# Patient Record
Sex: Female | Born: 1935 | Race: White | Hispanic: No | Smoking: Never smoker
Health system: Southern US, Community
[De-identification: ages and names within clinical notes are randomized; demographics above are authoritative.]

## PROBLEM LIST (undated history)

## (undated) DIAGNOSIS — K219 Gastro-esophageal reflux disease without esophagitis: Secondary | ICD-10-CM

## (undated) DIAGNOSIS — B029 Zoster without complications: Secondary | ICD-10-CM

## (undated) DIAGNOSIS — I739 Peripheral vascular disease, unspecified: Secondary | ICD-10-CM

## (undated) DIAGNOSIS — K760 Fatty (change of) liver, not elsewhere classified: Secondary | ICD-10-CM

## (undated) DIAGNOSIS — K2270 Barrett's esophagus without dysplasia: Secondary | ICD-10-CM

## (undated) DIAGNOSIS — I10 Essential (primary) hypertension: Secondary | ICD-10-CM

## (undated) DIAGNOSIS — J45909 Unspecified asthma, uncomplicated: Secondary | ICD-10-CM

## (undated) DIAGNOSIS — K529 Noninfective gastroenteritis and colitis, unspecified: Secondary | ICD-10-CM

## (undated) DIAGNOSIS — H269 Unspecified cataract: Secondary | ICD-10-CM

## (undated) HISTORY — PX: EYE SURGERY: SHX253

## (undated) HISTORY — PX: CATARACT EXTRACTION: SUR2

## (undated) HISTORY — PX: SINUS SURGERY WITH INSTATRAK: SHX5215

---

## 2010-04-17 ENCOUNTER — Ambulatory Visit: Payer: Self-pay | Admitting: Internal Medicine

## 2010-05-30 ENCOUNTER — Ambulatory Visit: Payer: Self-pay | Admitting: Family Medicine

## 2010-06-29 ENCOUNTER — Ambulatory Visit: Payer: Self-pay | Admitting: Internal Medicine

## 2011-08-09 ENCOUNTER — Encounter: Payer: Self-pay | Admitting: *Deleted

## 2011-09-06 ENCOUNTER — Encounter: Payer: Self-pay | Admitting: *Deleted

## 2011-10-07 ENCOUNTER — Encounter: Payer: Self-pay | Admitting: *Deleted

## 2011-11-04 ENCOUNTER — Encounter: Payer: Self-pay | Admitting: *Deleted

## 2011-12-05 ENCOUNTER — Encounter: Payer: Self-pay | Admitting: *Deleted

## 2012-07-27 ENCOUNTER — Ambulatory Visit: Payer: Self-pay | Admitting: Family Medicine

## 2016-10-03 ENCOUNTER — Ambulatory Visit
Admission: RE | Admit: 2016-10-03 | Discharge: 2016-10-03 | Disposition: A | Discharge status: Home / Self Care | Payer: Medicare Other | Source: Ambulatory Visit | Attending: Specialist | Admitting: Specialist

## 2016-10-03 ENCOUNTER — Other Ambulatory Visit: Payer: Self-pay | Admitting: Specialist

## 2016-10-03 DIAGNOSIS — R05 Cough: Secondary | ICD-10-CM

## 2016-10-03 DIAGNOSIS — R918 Other nonspecific abnormal finding of lung field: Secondary | ICD-10-CM | POA: Insufficient documentation

## 2016-10-03 DIAGNOSIS — R059 Cough, unspecified: Secondary | ICD-10-CM

## 2017-06-05 ENCOUNTER — Ambulatory Visit
Admission: EM | Admit: 2017-06-05 | Discharge: 2017-06-05 | Disposition: A | Discharge status: Home / Self Care | Payer: Medicare Other | Attending: Family Medicine | Admitting: Family Medicine

## 2017-06-05 ENCOUNTER — Encounter: Payer: Self-pay | Admitting: *Deleted

## 2017-06-05 DIAGNOSIS — M5432 Sciatica, left side: Secondary | ICD-10-CM

## 2017-06-05 HISTORY — DX: Fatty (change of) liver, not elsewhere classified: K76.0

## 2017-06-05 HISTORY — DX: Unspecified asthma, uncomplicated: J45.909

## 2017-06-05 HISTORY — DX: Gastro-esophageal reflux disease without esophagitis: K21.9

## 2017-06-05 HISTORY — DX: Peripheral vascular disease, unspecified: I73.9

## 2017-06-05 HISTORY — DX: Barrett's esophagus without dysplasia: K22.70

## 2017-06-05 HISTORY — DX: Essential (primary) hypertension: I10

## 2017-06-05 HISTORY — DX: Zoster without complications: B02.9

## 2017-06-05 HISTORY — DX: Unspecified cataract: H26.9

## 2017-06-05 HISTORY — DX: Noninfective gastroenteritis and colitis, unspecified: K52.9

## 2017-06-05 MED ORDER — PREDNISONE 20 MG PO TABS: ORAL_TABLET | ORAL | 0 refills | Status: AC

## 2017-06-05 MED ORDER — HYDROCODONE-ACETAMINOPHEN 5-325 MG PO TABS: ORAL_TABLET | ORAL | 0 refills | Status: AC

## 2017-06-05 NOTE — ED Provider Notes (Signed)
[facility]    CSN: [id number] Arrival date & time: 06/05/17  0947     History   Chief Complaint Chief Complaint  Patient presents with  . Back Pain  . Hip Pain    HPI Omeka TACEY DIMAGGIO is a 82 y.o. female.   82 yo female with a c/o left low back and left buttock pain radiating down the back of the leg since yesterday. Denies any falls, injuries, heavy lifting, numbness/tingling, bowel or bladder incontinence or retention.    The history is provided by the patient.  Back Pain  Hip Pain     Past Medical History:  Diagnosis Date  . Asthma   . Barrett esophagus   . Cataract   . Colitis   . Fatty liver   . GERD (gastroesophageal reflux disease)   . Herpes zoster   . Hypertension   . PVD (peripheral vascular disease) (HCC)     There are no active problems to display for this patient.   Past Surgical History:  Procedure Laterality Date  . CATARACT EXTRACTION    . EYE SURGERY    . SINUS SURGERY WITH INSTATRAK      OB History    No data available       Home Medications    Prior to Admission medications   Medication Sig Start Date End Date Taking? Authorizing Provider  albuterol (PROVENTIL HFA;VENTOLIN HFA) 108 (90 Base) MCG/ACT inhaler Inhale 2 puffs into the lungs every 6 (six) hours as needed for wheezing or shortness of breath.   Yes [provider]  amLODipine (NORVASC) 5 MG tablet Take 5 mg by mouth daily.   Yes [provider]  azelastine (ASTELIN) 0.1 % nasal spray Place 2 sprays into both nostrils 2 (two) times daily. Use in each nostril as directed   Yes [provider]  diclofenac sodium (VOLTAREN) 1 % GEL Apply 2 g topically 4 (four) times daily.   Yes [provider]  esomeprazole (NEXIUM) 40 MG capsule Take 40 mg by mouth daily at 12 noon.   Yes [provider]  Fluticasone-Salmeterol (ADVAIR) 250-50 MCG/DOSE AEPB Inhale 1 puff into the lungs 2 (two) times daily.   Yes [provider]  levothyroxine (SYNTHROID, LEVOTHROID) 25 MCG tablet Take 25 mcg by mouth daily before breakfast.   Yes [provider]  MOMETASONE FUROATE NA Place into the nose.   Yes [provider]  montelukast (SINGULAIR) 10 MG tablet Take 10 mg by mouth at bedtime.   Yes [provider]  SUMAtriptan (IMITREX) 50 MG tablet Take 50 mg by mouth every 2 (two) hours as needed for migraine. May repeat in 2 hours if headache persists or recurs.   Yes [provider]  tiotropium (SPIRIVA) 18 MCG inhalation capsule Place 18 mcg into inhaler and inhale daily.   Yes [provider]  topiramate (TOPAMAX) 50 MG tablet Take 50 mg by mouth 2 (two) times daily.   Yes [provider]  HYDROcodone-acetaminophen (NORCO/VICODIN) 5-325 MG tablet 1-2 tabs po qd prn severe pain 06/05/17   Payton Mccallum, MD  predniSONE (DELTASONE) 20 MG tablet 2 tabs po qd for 2 days, then 1 tab po qd for 2 days, then half at tab po qd for 2 days 06/05/17   Payton Mccallum, MD    Family History Family History  Problem Relation Age of Onset  . Cancer Father   . Macular degeneration Sister   . Macular degeneration Brother  Social History Social History  Substance Use Topics  . Smoking status: Never Smoker  . Smokeless tobacco: Never Used  . Alcohol use No     Allergies   Erythromycin and Sulfa antibiotics   Review of Systems Review of Systems  Musculoskeletal: Positive for back pain.     Physical Exam Triage Vital Signs ED Triage Vitals  Enc Vitals Group     BP 06/05/17 1048 (!) 141/64     Pulse Rate 06/05/17 1048 90     Resp 06/05/17 1048 16     Temp 06/05/17 1048 98.3 F (36.8 C)     Temp Source 06/05/17 1048 Oral     SpO2 06/05/17 1048 97 %     Weight 06/05/17 1049 152 lb (68.9 kg)     Height 06/05/17 1049  (1.676 m)     Head Circumference --      Peak Flow --      Pain Score 06/05/17 1050 9     Pain Loc --      Pain Edu? --      Excl.  in GC? --    No data found.   Updated Vital Signs BP (!) 141/64 (BP Location: Left Arm)   Pulse 90   Temp 98.3 F (36.8 C) (Oral)   Resp 16   Ht  (1.676 m)   Wt 152 lb (68.9 kg)   SpO2 97%   BMI 24.53 kg/m   Visual Acuity Right Eye Distance:   Left Eye Distance:   Bilateral Distance:    Right Eye Near:   Left Eye Near:    Bilateral Near:     Physical Exam  Constitutional: She appears well-developed and well-nourished. No distress.  Musculoskeletal:       Lumbar back: She exhibits tenderness (over the left lumbar sacral and left buttock). She exhibits normal range of motion, no bony tenderness, no swelling, no edema, no deformity, no laceration, no pain and normal pulse.  Skin: She is not diaphoretic.  Nursing note and vitals reviewed.    UC Treatments / Results  Labs (all labs ordered are listed, but only abnormal results are displayed) Labs Reviewed - No data to display  EKG  EKG Interpretation None       Radiology No results found.  Procedures Procedures (including critical care time)  Medications Ordered in UC Medications - No data to display   Initial Impression / Assessment and Plan / UC Course  I have reviewed the triage vital signs and the nursing notes.  Pertinent labs & imaging results that were available during my care of the patient were reviewed by me and considered in my medical decision making (see chart for details).       Final Clinical Impressions(s) / UC Diagnoses   Final diagnoses:  Sciatica of left side    New Prescriptions Discharge Medication List as of 06/05/2017 11:59 AM    START taking these medications   Details  HYDROcodone-acetaminophen (NORCO/VICODIN) 5-325 MG tablet 1-2 tabs po qd prn severe pain, Print       1.  diagnosis reviewed with patient 2. rx as per orders above; reviewed possible side effects, interactions, risks and benefits  3. Recommend supportive treatment with otc tylenol prn 4.  Follow-up prn if symptoms worsen or don't improve  Controlled Substance Prescriptions [place] Controlled Substance Registry consulted? Not Applicable   Payton Mccallum, MD 06/05/17 [phone]

## 2017-06-05 NOTE — ED Triage Notes (Signed)
Low back pain, left hip pain. Radiates to left leg at times. Denies injury.

## 2017-06-09 ENCOUNTER — Ambulatory Visit
Admission: RE | Admit: 2017-06-09 | Discharge: 2017-06-09 | Disposition: A | Discharge status: Home / Self Care | Payer: Medicare Other | Source: Ambulatory Visit | Attending: Specialist | Admitting: Specialist

## 2017-06-09 ENCOUNTER — Other Ambulatory Visit: Payer: Self-pay | Admitting: Specialist

## 2017-06-09 DIAGNOSIS — M5440 Lumbago with sciatica, unspecified side: Secondary | ICD-10-CM

## 2017-06-09 DIAGNOSIS — M47816 Spondylosis without myelopathy or radiculopathy, lumbar region: Secondary | ICD-10-CM | POA: Insufficient documentation

## 2017-06-09 DIAGNOSIS — M549 Dorsalgia, unspecified: Secondary | ICD-10-CM | POA: Diagnosis present

## 2017-08-07 ENCOUNTER — Telehealth (HOSPITAL_COMMUNITY): Payer: Self-pay

## 2017-08-07 NOTE — Telephone Encounter (Signed)
Attempted to call patient in regards to Pulmonary Rehab - Lm on Vm °

## 2017-08-07 NOTE — Telephone Encounter (Signed)
Patient's insurance is active and benefits verified through [facility] - $20.00 co-pay, no deductible, out of pocket amount of $4,400/$1,046.60 has been met, no co-insurance, and no pre-authorization is required. Reference [phone]  Patient will be contacted and scheduled.

## 2017-08-07 NOTE — Telephone Encounter (Signed)
Patient returned phone call in regards to Pulmonary Rehab - Patient would like to participate in program at [facility]. Faxed over referral to [facility]. Closing referral.

## 2017-09-18 ENCOUNTER — Encounter: Payer: Medicare Other | Attending: Pulmonary Disease

## 2017-09-18 ENCOUNTER — Other Ambulatory Visit: Payer: Self-pay

## 2017-09-18 VITALS — Ht 66.0 in | Wt 151.5 lb

## 2017-09-18 DIAGNOSIS — J4540 Moderate persistent asthma, uncomplicated: Secondary | ICD-10-CM | POA: Insufficient documentation

## 2017-09-18 DIAGNOSIS — I739 Peripheral vascular disease, unspecified: Secondary | ICD-10-CM | POA: Insufficient documentation

## 2017-09-18 DIAGNOSIS — I10 Essential (primary) hypertension: Secondary | ICD-10-CM | POA: Diagnosis not present

## 2017-09-18 DIAGNOSIS — K219 Gastro-esophageal reflux disease without esophagitis: Secondary | ICD-10-CM | POA: Insufficient documentation

## 2017-09-18 DIAGNOSIS — Z79899 Other long term (current) drug therapy: Secondary | ICD-10-CM | POA: Insufficient documentation

## 2017-09-18 NOTE — Patient Instructions (Signed)
Patient Instructions  Patient Details  Name: Kim Owen MRN: [id number] Date of Birth: 07/02/1935 Referring Provider:  Otis Dials, MD  Below are the personal goals you chose as well as exercise and nutrition goals. Our goal is to help you keep on track towards obtaining and maintaining your goals. We will be discussing your progress on these goals with you throughout the program.  Initial Exercise Prescription: Initial Exercise Prescription - 09/18/17 1600      Date of Initial Exercise RX and Referring Provider   Date  09/18/17    Referring Provider  Otis Dials MD      Treadmill   MPH  1.5    Grade  0.5    Minutes  15    METs  2.25      NuStep   Level  1    SPM  80    Minutes  15    METs  2      Arm Ergometer   Level  1    Watts  22    RPM  25    Minutes  15    METs  2      Prescription Details   Frequency (times per week)  3    Duration  Progress to 45 minutes of aerobic exercise without signs/symptoms of physical distress      Intensity   THRR 40-80% of Max Heartrate  106-129    Ratings of Perceived Exertion  11-13    Perceived Dyspnea  0-4      Progression   Progression  Continue to progress workloads to maintain intensity without signs/symptoms of physical distress.      Resistance Training   Training Prescription  Yes    Weight  3 lbs    Reps  10-15       Exercise Goals: Frequency: Be able to perform aerobic exercise three times per week working toward 3-5 days per week.  Intensity: Work with a perceived exertion of 11 (fairly light) - 15 (hard) as tolerated. Follow your new exercise prescription and watch for changes in prescription as you progress with the program. Changes will be reviewed with you when they are made.  Duration: You should be able to do 30 minutes of continuous aerobic exercise in addition to a 5 minute warm-up and a 5 minute cool-down routine.  Nutrition Goals: Your personal nutrition goals will be established  when you do your nutrition analysis with the dietician.  The following are nutrition guidelines to follow: Cholesterol < 200mg /day Sodium < 1500mg /day Fiber: Women over 50 yrs - 21 grams per day  Personal Goals: Personal Goals and Risk Factors at Admission - 09/18/17 1528      Core Components/Risk Factors/Patient Goals on Admission    Weight Management  Yes;Weight Loss    Intervention  Weight Management: Develop a combined nutrition and exercise program designed to reach desired caloric intake, while maintaining appropriate intake of nutrient and fiber, sodium and fats, and appropriate energy expenditure required for the weight goal.;Weight Management: Provide education and appropriate resources to help participant work on and attain dietary goals.;Weight Management/Obesity: Establish reasonable short term and long term weight goals.    Admit Weight  151 lb 8 oz (68.7 kg)    Goal Weight: Short Term  145 lb (65.8 kg)    Goal Weight: Long Term  145 lb (65.8 kg)    Expected Outcomes  Short Term: Continue to assess and modify interventions until short term weight is achieved;Long  Term: Adherence to nutrition and physical activity/exercise program aimed toward attainment of established weight goal;Weight Loss: Understanding of general recommendations for a balanced deficit meal plan, which promotes 1-2 lb weight loss per week and includes a negative energy balance of [phone] kcal/d;Understanding recommendations for meals to include 15-35% energy as protein, 25-35% energy from fat, 35-60% energy from carbohydrates, less than 200mg  of dietary cholesterol, 20-35 gm of total fiber daily;Understanding of distribution of calorie intake throughout the day with the consumption of 4-5 meals/snacks    Improve shortness of breath with ADL's  Yes    Intervention  Provide education, individualized exercise plan and daily activity instruction to help decrease symptoms of SOB with activities of daily living.     Expected Outcomes  Short Term: Achieves a reduction of symptoms when performing activities of daily living.    Hypertension  Yes takes medication    Intervention  Provide education on lifestyle modifcations including regular physical activity/exercise, weight management, moderate sodium restriction and increased consumption of fresh fruit, vegetables, and low fat dairy, alcohol moderation, and smoking cessation.;Monitor prescription use compliance.    Expected Outcomes  Short Term: Continued assessment and intervention until BP is < 140/88mm HG in hypertensive participants. < 130/85mm HG in hypertensive participants with diabetes, heart failure or chronic kidney disease.;Long Term: Maintenance of blood pressure at goal levels.       Tobacco Use Initial Evaluation: Social History   Tobacco Use  Smoking Status Never Smoker  Smokeless Tobacco Never Used    Exercise Goals and Review: Exercise Goals    Row Name 09/18/17 1619             Exercise Goals   Increase Physical Activity  Yes       Intervention  Provide advice, education, support and counseling about physical activity/exercise needs.;Develop an individualized exercise prescription for aerobic and resistive training based on initial evaluation findings, risk stratification, comorbidities and participant's personal goals.       Expected Outcomes  Achievement of increased cardiorespiratory fitness and enhanced flexibility, muscular endurance and strength shown through measurements of functional capacity and personal statement of participant.       Increase Strength and Stamina  Yes       Intervention  Provide advice, education, support and counseling about physical activity/exercise needs.;Develop an individualized exercise prescription for aerobic and resistive training based on initial evaluation findings, risk stratification, comorbidities and participant's personal goals.       Expected Outcomes  Achievement of increased  cardiorespiratory fitness and enhanced flexibility, muscular endurance and strength shown through measurements of functional capacity and personal statement of participant.       Able to understand and use rate of perceived exertion (RPE) scale  Yes       Intervention  Provide education and explanation on how to use RPE scale       Expected Outcomes  Short Term: Able to use RPE daily in rehab to express subjective intensity level;Long Term:  Able to use RPE to guide intensity level when exercising independently       Able to understand and use Dyspnea scale  Yes       Intervention  Provide education and explanation on how to use Dyspnea scale       Expected Outcomes  Short Term: Able to use Dyspnea scale daily in rehab to express subjective sense of shortness of breath during exertion;Long Term: Able to use Dyspnea scale to guide intensity level when exercising independently  Knowledge and understanding of Target Heart Rate Range (THRR)  Yes       Intervention  Provide education and explanation of THRR including how the numbers were predicted and where they are located for reference       Expected Outcomes  Short Term: Able to state/look up THRR;Long Term: Able to use THRR to govern intensity when exercising independently;Short Term: Able to use daily as guideline for intensity in rehab       Able to check pulse independently  Yes       Intervention  Provide education and demonstration on how to check pulse in carotid and radial arteries.;Review the importance of being able to check your own pulse for safety during independent exercise       Expected Outcomes  Short Term: Able to explain why pulse checking is important during independent exercise;Long Term: Able to check pulse independently and accurately       Understanding of Exercise Prescription  Yes       Intervention  Provide education, explanation, and written materials on patient's individual exercise prescription       Expected Outcomes   Short Term: Able to explain program exercise prescription;Long Term: Able to explain home exercise prescription to exercise independently          Copy of goals given to participant.

## 2017-09-18 NOTE — Progress Notes (Signed)
Pulmonary Individual Treatment Plan  Patient Details  Name: Kim Owen MRN: [id number] Date of Birth: 1935/08/09 Referring Provider:     Pulmonary Rehab from 09/18/2017 in [facility] Cardiac and Pulmonary Rehab  Referring Provider  Otis Dials MD      Initial Encounter Date:    Pulmonary Rehab from 09/18/2017 in [facility] Cardiac and Pulmonary Rehab  Date  09/18/17  Referring Provider  Otis Dials MD      Visit Diagnosis: Moderate persistent asthma without complication  Patient's Home Medications on Admission:  Current Outpatient Medications:  .  albuterol (PROVENTIL HFA;VENTOLIN HFA) 108 (90 Base) MCG/ACT inhaler, Inhale 2 puffs into the lungs every 6 (six) hours as needed for wheezing or shortness of breath., Disp: , Rfl:  .  amLODipine (NORVASC) 5 MG tablet, Take 5 mg by mouth daily., Disp: , Rfl:  .  azelastine (ASTELIN) 0.1 % nasal spray, Place 2 sprays into both nostrils 2 (two) times daily. Use in each nostril as directed, Disp: , Rfl:  .  diclofenac sodium (VOLTAREN) 1 % GEL, Apply 2 g topically 4 (four) times daily., Disp: , Rfl:  .  esomeprazole (NEXIUM) 40 MG capsule, Take 40 mg by mouth daily at 12 noon., Disp: , Rfl:  .  Fluticasone-Salmeterol (ADVAIR) 250-50 MCG/DOSE AEPB, Inhale 1 puff into the lungs 2 (two) times daily., Disp: , Rfl:  .  HYDROcodone-acetaminophen (NORCO/VICODIN) 5-325 MG tablet, 1-2 tabs po qd prn severe pain, Disp: 6 tablet, Rfl: 0 .  levothyroxine (SYNTHROID, LEVOTHROID) 25 MCG tablet, Take 25 mcg by mouth daily before breakfast., Disp: , Rfl:  .  MOMETASONE FUROATE NA, Place into the nose., Disp: , Rfl:  .  montelukast (SINGULAIR) 10 MG tablet, Take 10 mg by mouth at bedtime., Disp: , Rfl:  .  predniSONE (DELTASONE) 20 MG tablet, 2 tabs po qd for 2 days, then 1 tab po qd for 2 days, then half at tab po qd for 2 days, Disp: 7 tablet, Rfl: 0 .  SUMAtriptan (IMITREX) 50 MG tablet, Take 50 mg by mouth every 2 (two) hours as needed for migraine. May  repeat in 2 hours if headache persists or recurs., Disp: , Rfl:  .  tiotropium (SPIRIVA) 18 MCG inhalation capsule, Place 18 mcg into inhaler and inhale daily., Disp: , Rfl:  .  topiramate (TOPAMAX) 50 MG tablet, Take 50 mg by mouth 2 (two) times daily., Disp: , Rfl:   Past Medical History: Past Medical History:  Diagnosis Date  . Asthma   . Barrett esophagus   . Cataract   . Colitis   . Fatty liver   . GERD (gastroesophageal reflux disease)   . Herpes zoster   . Hypertension   . PVD (peripheral vascular disease) (HCC)     Tobacco Use: Social History   Tobacco Use  Smoking Status Never Smoker  Smokeless Tobacco Never Used    Labs: Recent Review Flowsheet Data    There is no flowsheet data to display.       Pulmonary Assessment Scores: Pulmonary Assessment Scores    Row Name 09/18/17 1521         ADL UCSD   ADL Phase  Entry     SOB Score total  93     Rest  2     Walk  3     Stairs  5     Bath  4     Dress  4     Shop  4  CAT Score   CAT Score  26        Pulmonary Function Assessment: Pulmonary Function Assessment - 09/18/17 1523      Breath   Bilateral Breath Sounds  Clear    Shortness of Breath  Limiting activity;Yes       Exercise Target Goals: Date: 09/18/17  Exercise Program Goal: Individual exercise prescription set with THRR, safety & activity barriers. Participant demonstrates ability to understand and report RPE using BORG scale, to self-measure pulse accurately, and to acknowledge the importance of the exercise prescription.  Exercise Prescription Goal: Starting with aerobic activity 30 plus minutes a day, 3 days per week for initial exercise prescription. Provide home exercise prescription and guidelines that participant acknowledges understanding prior to discharge.  Activity Barriers & Risk Stratification: Activity Barriers & Cardiac Risk Stratification - 09/18/17 1613      Activity Barriers & Cardiac Risk Stratification    Activity Barriers  Muscular Weakness;Deconditioning;Shortness of Breath;Joint Problems;Arthritis;Back Problems;Balance Concerns right rotator cuff tear, occasional back pain       6 Minute Walk: 6 Minute Walk    Row Name 09/18/17 1610         6 Minute Walk   Phase  Initial     Distance  930 feet     Walk Time  6 minutes     # of Rest Breaks  0     MPH  1.76     METS  1.98     RPE  11     Perceived Dyspnea   1     VO2 Peak  6.94     Symptoms  Yes (comment)     Comments  SOB, staggered gait     Resting HR  84 bpm     Resting BP  136/70     Resting Oxygen Saturation   98 %     Exercise Oxygen Saturation  during 6 min walk  98 %     Max Ex. HR  110 bpm     Max Ex. BP  134/68     2 Minute Post BP  132/64       Interval HR   1 Minute HR  103     2 Minute HR  107     3 Minute HR  110     4 Minute HR  107     5 Minute HR  106     6 Minute HR  106     2 Minute Post HR  93     Interval Heart Rate?  Yes       Interval Oxygen   Interval Oxygen?  Yes     Baseline Oxygen Saturation %  98 %     1 Minute Oxygen Saturation %  98 %     1 Minute Liters of Oxygen  0 L Room Air     2 Minute Oxygen Saturation %  98 %     2 Minute Liters of Oxygen  0 L     3 Minute Oxygen Saturation %  98 %     3 Minute Liters of Oxygen  0 L     4 Minute Oxygen Saturation %  98 %     4 Minute Liters of Oxygen  0 L     5 Minute Oxygen Saturation %  98 %     5 Minute Liters of Oxygen  0 L     6 Minute Oxygen Saturation %  99 %     6 Minute Liters of Oxygen  0 L     2 Minute Post Oxygen Saturation %  99 %     2 Minute Post Liters of Oxygen  0 L       Oxygen Initial Assessment: Oxygen Initial Assessment - 09/18/17 1526      Home Oxygen   Home Oxygen Device  None    Sleep Oxygen Prescription  None;CPAP never owned or used a CPAP    Home Exercise Oxygen Prescription  None    Home at Rest Exercise Oxygen Prescription  None    Compliance with Home Oxygen Use  No    Comments  -- does not wear CPAP       Initial 6 min Walk   Oxygen Used  None      Program Oxygen Prescription   Program Oxygen Prescription  None      Intervention   Short Term Goals  To learn and demonstrate proper use of respiratory medications;To learn and demonstrate proper pursed lip breathing techniques or other breathing techniques.;To learn and understand importance of maintaining oxygen saturations>88%;To learn and understand importance of monitoring SPO2 with pulse oximeter and demonstrate accurate use of the pulse oximeter.    Long  Term Goals  Demonstrates proper use of MDI's;Compliance with respiratory medication;Exhibits proper breathing techniques, such as pursed lip breathing or other method taught during program session;Maintenance of O2 saturations>88%;Verbalizes importance of monitoring SPO2 with pulse oximeter and return demonstration       Oxygen Re-Evaluation:   Oxygen Discharge (Final Oxygen Re-Evaluation):   Initial Exercise Prescription: Initial Exercise Prescription - 09/18/17 1600      Date of Initial Exercise RX and Referring Provider   Date  09/18/17    Referring Provider  Otis Dials MD      Treadmill   MPH  1.5    Grade  0.5    Minutes  15    METs  2.25      NuStep   Level  1    SPM  80    Minutes  15    METs  2      Arm Ergometer   Level  1    Watts  22    RPM  25    Minutes  15    METs  2      Prescription Details   Frequency (times per week)  3    Duration  Progress to 45 minutes of aerobic exercise without signs/symptoms of physical distress      Intensity   THRR 40-80% of Max Heartrate  106-129    Ratings of Perceived Exertion  11-13    Perceived Dyspnea  0-4      Progression   Progression  Continue to progress workloads to maintain intensity without signs/symptoms of physical distress.      Resistance Training   Training Prescription  Yes    Weight  3 lbs    Reps  10-15       Perform Capillary Blood Glucose checks as needed.  Exercise  Prescription Changes: Exercise Prescription Changes    Row Name 09/18/17 1600             Response to Exercise   Blood Pressure (Admit)  136/70       Blood Pressure (Exercise)  134/68       Blood Pressure (Exit)  132/64       Heart Rate (Admit)  84 bpm  Heart Rate (Exercise)  110 bpm       Heart Rate (Exit)  93 bpm       Oxygen Saturation (Admit)  98 %       Oxygen Saturation (Exercise)  98 %       Oxygen Saturation (Exit)  99 %       Rating of Perceived Exertion (Exercise)  11       Perceived Dyspnea (Exercise)  1       Symptoms  SOB, staggered gait       Comments  walk test results          Exercise Comments:   Exercise Goals and Review: Exercise Goals    Row Name 09/18/17 1619             Exercise Goals   Increase Physical Activity  Yes       Intervention  Provide advice, education, support and counseling about physical activity/exercise needs.;Develop an individualized exercise prescription for aerobic and resistive training based on initial evaluation findings, risk stratification, comorbidities and participant's personal goals.       Expected Outcomes  Achievement of increased cardiorespiratory fitness and enhanced flexibility, muscular endurance and strength shown through measurements of functional capacity and personal statement of participant.       Increase Strength and Stamina  Yes       Intervention  Provide advice, education, support and counseling about physical activity/exercise needs.;Develop an individualized exercise prescription for aerobic and resistive training based on initial evaluation findings, risk stratification, comorbidities and participant's personal goals.       Expected Outcomes  Achievement of increased cardiorespiratory fitness and enhanced flexibility, muscular endurance and strength shown through measurements of functional capacity and personal statement of participant.       Able to understand and use rate of perceived exertion  (RPE) scale  Yes       Intervention  Provide education and explanation on how to use RPE scale       Expected Outcomes  Short Term: Able to use RPE daily in rehab to express subjective intensity level;Long Term:  Able to use RPE to guide intensity level when exercising independently       Able to understand and use Dyspnea scale  Yes       Intervention  Provide education and explanation on how to use Dyspnea scale       Expected Outcomes  Short Term: Able to use Dyspnea scale daily in rehab to express subjective sense of shortness of breath during exertion;Long Term: Able to use Dyspnea scale to guide intensity level when exercising independently       Knowledge and understanding of Target Heart Rate Range (THRR)  Yes       Intervention  Provide education and explanation of THRR including how the numbers were predicted and where they are located for reference       Expected Outcomes  Short Term: Able to state/look up THRR;Long Term: Able to use THRR to govern intensity when exercising independently;Short Term: Able to use daily as guideline for intensity in rehab       Able to check pulse independently  Yes       Intervention  Provide education and demonstration on how to check pulse in carotid and radial arteries.;Review the importance of being able to check your own pulse for safety during independent exercise       Expected Outcomes  Short Term: Able to explain why pulse checking is  important during independent exercise;Long Term: Able to check pulse independently and accurately       Understanding of Exercise Prescription  Yes       Intervention  Provide education, explanation, and written materials on patient's individual exercise prescription       Expected Outcomes  Short Term: Able to explain program exercise prescription;Long Term: Able to explain home exercise prescription to exercise independently          Exercise Goals Re-Evaluation :   Discharge Exercise Prescription (Final  Exercise Prescription Changes): Exercise Prescription Changes - 09/18/17 1600      Response to Exercise   Blood Pressure (Admit)  136/70    Blood Pressure (Exercise)  134/68    Blood Pressure (Exit)  132/64    Heart Rate (Admit)  84 bpm    Heart Rate (Exercise)  110 bpm    Heart Rate (Exit)  93 bpm    Oxygen Saturation (Admit)  98 %    Oxygen Saturation (Exercise)  98 %    Oxygen Saturation (Exit)  99 %    Rating of Perceived Exertion (Exercise)  11    Perceived Dyspnea (Exercise)  1    Symptoms  SOB, staggered gait    Comments  walk test results       Nutrition:  Target Goals: Understanding of nutrition guidelines, daily intake of sodium 1500mg , cholesterol 200mg , calories 30% from fat and 7% or less from saturated fats, daily to have 5 or more servings of fruits and vegetables.  Biometrics: Pre Biometrics - 09/18/17 1620      Pre Biometrics   Height  5\' 6"  (1.676 m)    Weight  151 lb 8 oz (68.7 kg)    Waist Circumference  36 inches    Hip Circumference  40 inches    Waist to Hip Ratio  0.9 %    BMI (Calculated)  24.46        Nutrition Therapy Plan and Nutrition Goals: Nutrition Therapy & Goals - 09/18/17 1519      Nutrition Therapy   RD appointment defered  --      Personal Nutrition Goals   Comments  patient would like to lose 10 pounds. patient stated that she knows what she should not eat but eats it anyways.      Intervention Plan   Intervention  Prescribe, educate and counsel regarding individualized specific dietary modifications aiming towards targeted core components such as weight, hypertension, lipid management, diabetes, heart failure and other comorbidities.;Nutrition handout(s) given to patient.    Expected Outcomes  Short Term Goal: Understand basic principles of dietary content, such as calories, fat, sodium, cholesterol and nutrients.;Long Term Goal: Adherence to prescribed nutrition plan.       Nutrition Discharge: Rate Your Plate  Scores: Nutrition Assessments - 09/18/17 1524      MEDFICTS Scores   Pre Score  34       Nutrition Goals Re-Evaluation:   Nutrition Goals Discharge (Final Nutrition Goals Re-Evaluation):   Psychosocial: Target Goals: Acknowledge presence or absence of significant depression and/or stress, maximize coping skills, provide positive support system. Participant is able to verbalize types and ability to use techniques and skills needed for reducing stress and depression.   Initial Review & Psychosocial Screening: Initial Psych Review & Screening - 09/18/17 1515      Initial Review   Current issues with  Current Depression;Current Sleep Concerns;Current Stress Concerns    Source of Stress Concerns  Chronic Illness;Family;Unable to perform  yard/household activities;Unable to participate in former interests or hobbies    Comments  Her son has been arrested for hugging people and has made a big impact on her and her family. She is also stressed because she cannot do some of the things she used to.       Family Dynamics   Good Support System?  Yes    Comments  Her son, daughter in law and daughter.      Barriers   Psychosocial barriers to participate in program  The patient should benefit from training in stress management and relaxation.      Screening Interventions   Interventions  Yes;Encouraged to exercise;Program counselor consult;Provide feedback about the scores to participant;To provide support and resources with identified psychosocial needs    Expected Outcomes  Long Term goal: The participant improves quality of Life and PHQ9 Scores as seen by post scores and/or verbalization of changes;Short Term goal: Identification and review with participant of any Quality of Life or Depression concerns found by scoring the questionnaire.;Long Term Goal: Stressors or current issues are controlled or eliminated.;Short Term goal: Utilizing psychosocial counselor, staff and physician to assist with  identification of specific Stressors or current issues interfering with healing process. Setting desired goal for each stressor or current issue identified.       Quality of Life Scores:   PHQ-9: Recent Review Flowsheet Data    Depression screen [facility] 2/9 09/18/2017   Decreased Interest 3   Down, Depressed, Hopeless 2   PHQ - 2 Score 5   Altered sleeping 2   Tired, decreased energy 3   Change in appetite 0   Feeling bad or failure about yourself  1   Trouble concentrating 1   Moving slowly or fidgety/restless 1   Suicidal thoughts 0   PHQ-9 Score 13   Difficult doing work/chores Somewhat difficult     Interpretation of Total Score  Total Score Depression Severity:  1-4 = Minimal depression, 5-9 = Mild depression, 10-14 = Moderate depression, 15-19 = Moderately severe depression, 20-27 = Severe depression   Psychosocial Evaluation and Intervention:   Psychosocial Re-Evaluation:   Psychosocial Discharge (Final Psychosocial Re-Evaluation):   Education: Education Goals: Education classes will be provided on a weekly basis, covering required topics. Participant will state understanding/return demonstration of topics presented.  Learning Barriers/Preferences: Learning Barriers/Preferences - 09/18/17 1526      Learning Barriers/Preferences   Learning Barriers  Sight wears reading glasses    Learning Preferences  None       Education Topics: Initial Evaluation Education: - Verbal, written and demonstration of respiratory meds, RPE/PD scales, oximetry and breathing techniques. Instruction on use of nebulizers and MDIs: cleaning and proper use, rinsing mouth with steroid doses and importance of monitoring MDI activations.   Pulmonary Rehab from 09/18/2017 in [facility] Cardiac and Pulmonary Rehab  Date  09/18/17  Educator  [facility]  Instruction Review Code  1- Verbalizes Understanding      General Nutrition Guidelines/Fats and Fiber: -Group instruction provided by verbal, written  material, models and posters to present the general guidelines for heart healthy nutrition. Gives an explanation and review of dietary fats and fiber.   Controlling Sodium/Reading Food Labels: -Group verbal and written material supporting the discussion of sodium use in heart healthy nutrition. Review and explanation with models, verbal and written materials for utilization of the food label.   Exercise Physiology & Risk Factors: - Group verbal and written instruction with models to review the exercise physiology of  the cardiovascular system and associated critical values. Details cardiovascular disease risk factors and the goals associated with each risk factor.   Aerobic Exercise & Resistance Training: - Gives group verbal and written discussion on the health impact of inactivity. On the components of aerobic and resistive training programs and the benefits of this training and how to safely progress through these programs.   Flexibility, Balance, General Exercise Guidelines: - Provides group verbal and written instruction on the benefits of flexibility and balance training programs. Provides general exercise guidelines with specific guidelines to those with heart or lung disease. Demonstration and skill practice provided.   Stress Management: - Provides group verbal and written instruction about the health risks of elevated stress, cause of high stress, and healthy ways to reduce stress.   Depression: - Provides group verbal and written instruction on the correlation between heart/lung disease and depressed mood, treatment options, and the stigmas associated with seeking treatment.   Exercise & Equipment Safety: - Individual verbal instruction and demonstration of equipment use and safety with use of the equipment.   Pulmonary Rehab from 09/18/2017 in [facility] Cardiac and Pulmonary Rehab  Date  09/18/17  Educator  [facility]  Instruction Review Code  1- Verbalizes Understanding       Infection Prevention: - Provides verbal and written material to individual with discussion of infection control including proper hand washing and proper equipment cleaning during exercise session.   Pulmonary Rehab from 09/18/2017 in [facility] Cardiac and Pulmonary Rehab  Date  09/18/17  Educator  [facility]  Instruction Review Code  1- Verbalizes Understanding      Falls Prevention: - Provides verbal and written material to individual with discussion of falls prevention and safety.   Pulmonary Rehab from 09/18/2017 in [facility] Cardiac and Pulmonary Rehab  Date  09/18/17  Educator  [facility]  Instruction Review Code  1- Verbalizes Understanding      Diabetes: - Individual verbal and written instruction to review signs/symptoms of diabetes, desired ranges of glucose level fasting, after meals and with exercise. Advice that pre and post exercise glucose checks will be done for 3 sessions at entry of program.   Chronic Lung Diseases: - Group verbal and written instruction to review new updates, new respiratory medications, new advancements in procedures and treatments. Provide informative websites and "800" numbers of self-education.   Lung Procedures: - Group verbal and written instruction to describe testing methods done to diagnose lung disease. Review the outcome of test results. Describe the treatment choices: Pulmonary Function Tests, ABGs and oximetry.   Energy Conservation: - Provide group verbal and written instruction for methods to conserve energy, plan and organize activities. Instruct on pacing techniques, use of adaptive equipment and posture/positioning to relieve shortness of breath.   Triggers: - Group verbal and written instruction to review types of environmental controls: home humidity, furnaces, filters, dust mite/pet prevention, HEPA vacuums. To discuss weather changes, air quality and the benefits of nasal washing.   Exacerbations: - Group verbal and written instruction to  provide: warning signs, infection symptoms, calling MD promptly, preventive modes, and value of vaccinations. Review: effective airway clearance, coughing and/or vibration techniques. Create an Sport and exercise psychologist.   Oxygen: - Individual and group verbal and written instruction on oxygen therapy. Includes supplement oxygen, available portable oxygen systems, continuous and intermittent flow rates, oxygen safety, concentrators, and Medicare reimbursement for oxygen.   Respiratory Medications: - Group verbal and written instruction to review medications for lung disease. Drug class, frequency, complications, importance of spacers, rinsing  mouth after steroid MDI's, and proper cleaning methods for nebulizers.   AED/CPR: - Group verbal and written instruction with the use of models to demonstrate the basic use of the AED with the basic ABC's of resuscitation.   Breathing Retraining: - Provides individuals verbal and written instruction on purpose, frequency, and proper technique of diaphragmatic breathing and pursed-lipped breathing. Applies individual practice skills.   Pulmonary Rehab from 09/18/2017 in [facility] Cardiac and Pulmonary Rehab  Date  09/18/17  Educator  [facility]  Instruction Review Code  1- Verbalizes Understanding      Anatomy and Physiology of the Lungs: - Group verbal and written instruction with the use of models to provide basic lung anatomy and physiology related to function, structure and complications of lung disease.   Anatomy & Physiology of the Heart: - Group verbal and written instruction and models provide basic cardiac anatomy and physiology, with the coronary electrical and arterial systems. Review of: AMI, Angina, Valve disease, Heart Failure, Cardiac Arrhythmia, Pacemakers, and the ICD.   Heart Failure: - Group verbal and written instruction on the basics of heart failure: signs/symptoms, treatments, explanation of ejection fraction, enlarged heart and  cardiomyopathy.   Sleep Apnea: - Individual verbal and written instruction to review Obstructive Sleep Apnea. Review of risk factors, methods for diagnosing and types of masks and machines for OSA.   Anxiety: - Provides group, verbal and written instruction on the correlation between heart/lung disease and anxiety, treatment options, and management of anxiety.   Relaxation: - Provides group, verbal and written instruction about the benefits of relaxation for patients with heart/lung disease. Also provides patients with examples of relaxation techniques.   Cardiac Medications: - Group verbal and written instruction to review commonly prescribed medications for heart disease. Reviews the medication, class of the drug, and side effects.   Know Your Numbers: -Group verbal and written instruction about important numbers in your health.  Review of Cholesterol, Blood Pressure, Diabetes, and BMI and the role they play in your overall health.   Other: -Provides group and verbal instruction on various topics (see comments)    Knowledge Questionnaire Score: Knowledge Questionnaire Score - 09/18/17 1523      Knowledge Questionnaire Score   Pre Score  14/18 reviewed with patient        Core Components/Risk Factors/Patient Goals at Admission: Personal Goals and Risk Factors at Admission - 09/18/17 1528      Core Components/Risk Factors/Patient Goals on Admission    Weight Management  Yes;Weight Loss    Intervention  Weight Management: Develop a combined nutrition and exercise program designed to reach desired caloric intake, while maintaining appropriate intake of nutrient and fiber, sodium and fats, and appropriate energy expenditure required for the weight goal.;Weight Management: Provide education and appropriate resources to help participant work on and attain dietary goals.;Weight Management/Obesity: Establish reasonable short term and long term weight goals.    Admit Weight  151 lb  8 oz (68.7 kg)    Goal Weight: Short Term  145 lb (65.8 kg)    Goal Weight: Long Term  145 lb (65.8 kg)    Expected Outcomes  Short Term: Continue to assess and modify interventions until short term weight is achieved;Long Term: Adherence to nutrition and physical activity/exercise program aimed toward attainment of established weight goal;Weight Loss: Understanding of general recommendations for a balanced deficit meal plan, which promotes 1-2 lb weight loss per week and includes a negative energy balance of [phone] kcal/d;Understanding recommendations for meals to include 15-35%  energy as protein, 25-35% energy from fat, 35-60% energy from carbohydrates, less than 200mg  of dietary cholesterol, 20-35 gm of total fiber daily;Understanding of distribution of calorie intake throughout the day with the consumption of 4-5 meals/snacks    Improve shortness of breath with ADL's  Yes    Intervention  Provide education, individualized exercise plan and daily activity instruction to help decrease symptoms of SOB with activities of daily living.    Expected Outcomes  Short Term: Achieves a reduction of symptoms when performing activities of daily living.    Hypertension  Yes takes medication    Intervention  Provide education on lifestyle modifcations including regular physical activity/exercise, weight management, moderate sodium restriction and increased consumption of fresh fruit, vegetables, and low fat dairy, alcohol moderation, and smoking cessation.;Monitor prescription use compliance.    Expected Outcomes  Short Term: Continued assessment and intervention until BP is < 140/10mm HG in hypertensive participants. < 130/34mm HG in hypertensive participants with diabetes, heart failure or chronic kidney disease.;Long Term: Maintenance of blood pressure at goal levels.       Core Components/Risk Factors/Patient Goals Review:    Core Components/Risk Factors/Patient Goals at Discharge (Final Review):     ITP Comments: ITP Comments    Row Name 09/18/17 1456           ITP Comments  Medical Evaluation completed. Chart sent for review and changes to Dr. Bethann Punches Director of [facility]. Diagnosis can be found in [facility] encounter 09/18/17          Comments: Initial ITP

## 2017-09-25 DIAGNOSIS — J4540 Moderate persistent asthma, uncomplicated: Secondary | ICD-10-CM

## 2017-09-25 NOTE — Progress Notes (Signed)
Daily Session Note  Patient Details  Name: Kim Owen MRN: [id number] Date of Birth: November 21, 1934 Referring Provider:     Pulmonary Rehab from 09/18/2017 in [facility] Cardiac and Pulmonary Rehab  Referring Provider  Thad Ranger MD      Encounter Date: 09/25/2017  Check In: Session Check In - 09/25/17 1137      Check-In   Location  [facility]-Cardiac & Pulmonary Rehab    Staff Present  Nada Maclachlan, BA, ACSM CEP, Exercise Physiologist;Kelly Amedeo Plenty, BS, ACSM CEP, Exercise Physiologist;Joseph Flavia Shipper    Supervising physician immediately available to respond to emergencies  [facility] immediately available ER MD    Physician(s)   Dr. Mable Paris and Cinda Quest    Medication changes reported      No    Fall or balance concerns reported     No    Warm-up and Cool-down  Performed as group-led instruction    Resistance Training Performed  Yes    VAD Patient?  No      Pain Assessment   Currently in Pain?  No/denies          Social History   Tobacco Use  Smoking Status Never Smoker  Smokeless Tobacco Never Used    Goals Met:  Exercise tolerated well Queuing for purse lip breathing No report of cardiac concerns or symptoms Strength training completed today  Goals Unmet:  Not Applicable  Comments: First full day of exercise!  Patient was oriented to gym and equipment including functions, settings, policies, and procedures.  Patient's individual exercise prescription and treatment plan were reviewed.  All starting workloads were established based on the results of the 6 minute walk test done at initial orientation visit.  The plan for exercise progression was also introduced and progression will be customized based on patient's performance and goals.   Dr. Emily Filbert is Medical Director for [place] and [facility].

## 2017-09-27 DIAGNOSIS — J4540 Moderate persistent asthma, uncomplicated: Secondary | ICD-10-CM

## 2017-09-27 NOTE — Progress Notes (Signed)
Daily Session Note  Patient Details  Name: Kim Owen MRN: [id number] Date of Birth: 03/12/1935 Referring Provider:     Pulmonary Rehab from 09/18/2017 in [facility] Cardiac and Pulmonary Rehab  Referring Provider  Thad Ranger MD      Encounter Date: 09/27/2017  Check In: Session Check In - 09/27/17 1122      Check-In   Location  [facility]-Cardiac & Pulmonary Rehab    Staff Present  Justin Mend RCP,RRT,BSRT;Meredith Sherryll Burger, RN BSN;Carroll Enterkin, RN, BSN    Supervising physician immediately available to respond to emergencies  [facility] immediately available ER MD    Physician(s)  Dr. Mariea Clonts and Jimmye Norman    Medication changes reported      No    Fall or balance concerns reported     No    Warm-up and Cool-down  Performed as group-led instruction    Resistance Training Performed  Yes    VAD Patient?  No      Pain Assessment   Currently in Pain?  No/denies          Social History   Tobacco Use  Smoking Status Never Smoker  Smokeless Tobacco Never Used    Goals Met:  Independence with exercise equipment Exercise tolerated well No report of cardiac concerns or symptoms Strength training completed today  Goals Unmet:  Not Applicable  Comments: Pt able to follow exercise prescription today without complaint.  Will continue to monitor for progression.   Dr. Emily Filbert is Medical Director for [place] and [facility].

## 2017-09-29 DIAGNOSIS — J4540 Moderate persistent asthma, uncomplicated: Secondary | ICD-10-CM

## 2017-09-29 NOTE — Progress Notes (Signed)
Daily Session Note  Patient Details  Name: Kim Owen MRN: [id number] Date of Birth: 1934/09/17 Referring Provider:     Pulmonary Rehab from 09/18/2017 in [facility] Cardiac and Pulmonary Rehab  Referring Provider  Thad Ranger MD      Encounter Date: 09/29/2017  Check In: Session Check In - 09/29/17 1141      Check-In   Location  [facility]-Cardiac & Pulmonary Rehab    Staff Present  Heath Lark, RN, BSN, CCRP;Joseph Hood RCP,RRT,BSRT;Mandi Vermillion, [place], [facility]    Supervising physician immediately available to respond to emergencies  [facility] immediately available ER MD    Physician(s)  Dr. Mariea Clonts and Jacqualine Code    Medication changes reported      No    Fall or balance concerns reported     No    Warm-up and Cool-down  Performed as group-led instruction    Resistance Training Performed  Yes    VAD Patient?  No      Pain Assessment   Currently in Pain?  No/denies          Social History   Tobacco Use  Smoking Status Never Smoker  Smokeless Tobacco Never Used    Goals Met:  Independence with exercise equipment Exercise tolerated well No report of cardiac concerns or symptoms Strength training completed today  Goals Unmet:  Not Applicable  Comments: Pt able to follow exercise prescription today without complaint.  Will continue to monitor for progression.   Dr. Emily Filbert is Medical Director for [place] and [facility].

## 2017-10-02 DIAGNOSIS — J4540 Moderate persistent asthma, uncomplicated: Secondary | ICD-10-CM | POA: Diagnosis not present

## 2017-10-02 NOTE — Progress Notes (Signed)
Daily Session Note  Patient Details  Name: Kim Owen MRN: [id number] Date of Birth: [birth year] Referring Provider:     Pulmonary Rehab from 09/18/2017 in [facility] Cardiac and Pulmonary Rehab  Referring Provider  Thad Ranger MD      Encounter Date: 10/02/2017  Check In: Session Check In - 10/02/17 1130      Check-In   Location  [facility]-Cardiac & Pulmonary Rehab    Staff Present  Nada Maclachlan, BA, ACSM CEP, Exercise Physiologist;Kelly Amedeo Plenty, BS, ACSM CEP, Exercise Physiologist;Zemira Zehring Flavia Shipper    Supervising physician immediately available to respond to emergencies  [facility] immediately available ER MD    Physician(s)  Dr. Lamar Laundry and Mariea Clonts    Medication changes reported      No    Fall or balance concerns reported     No    Warm-up and Cool-down  Performed as group-led instruction    Resistance Training Performed  Yes    VAD Patient?  No      Pain Assessment   Currently in Pain?  No/denies          Social History   Tobacco Use  Smoking Status Never Smoker  Smokeless Tobacco Never Used    Goals Met:  Independence with exercise equipment Exercise tolerated well No report of cardiac concerns or symptoms Strength training completed today  Goals Unmet:  Not Applicable  Comments: Pt able to follow exercise prescription today without complaint.  Will continue to monitor for progression.   Dr. Emily Filbert is Medical Director for [place] and [facility].

## 2017-10-06 ENCOUNTER — Encounter: Payer: Medicare Other | Attending: Pulmonary Disease

## 2017-10-06 DIAGNOSIS — J4540 Moderate persistent asthma, uncomplicated: Secondary | ICD-10-CM

## 2017-10-06 DIAGNOSIS — I10 Essential (primary) hypertension: Secondary | ICD-10-CM | POA: Insufficient documentation

## 2017-10-06 DIAGNOSIS — K219 Gastro-esophageal reflux disease without esophagitis: Secondary | ICD-10-CM | POA: Insufficient documentation

## 2017-10-06 DIAGNOSIS — Z79899 Other long term (current) drug therapy: Secondary | ICD-10-CM | POA: Insufficient documentation

## 2017-10-06 DIAGNOSIS — I739 Peripheral vascular disease, unspecified: Secondary | ICD-10-CM | POA: Insufficient documentation

## 2017-10-06 NOTE — Progress Notes (Signed)
Daily Session Note  Patient Details  Name: HANIN DECOOK MRN: [id number] Date of Birth: May 06, 1935 Referring Provider:     Pulmonary Rehab from 09/18/2017 in [facility] Cardiac and Pulmonary Rehab  Referring Provider  Thad Ranger MD      Encounter Date: 10/06/2017  Check In: Session Check In - 10/06/17 1151      Check-In   Location  [facility]-Cardiac & Pulmonary Rehab    Staff Present  Renita Papa, RN BSN;Jessica Luan Pulling, MA, RCEP, CCRP, Exercise Physiologist;Terral Cooks Flavia Shipper    Supervising physician immediately available to respond to emergencies  [facility] immediately available ER MD    Physician(s)   Dr. Burlene Arnt and Clearnce Hasten    Medication changes reported      No    Fall or balance concerns reported     No    Warm-up and Cool-down  Performed as group-led instruction    Resistance Training Performed  Yes    VAD Patient?  No      Pain Assessment   Currently in Pain?  No/denies          Social History   Tobacco Use  Smoking Status Never Smoker  Smokeless Tobacco Never Used    Goals Met:  Independence with exercise equipment Exercise tolerated well No report of cardiac concerns or symptoms Strength training completed today  Goals Unmet:  Not Applicable  Comments: Pt able to follow exercise prescription today without complaint.  Will continue to monitor for progression. Reviewed home exercise with pt today.  Pt plans to walk at home for exercise. She is going to be in [place] for the next two week and will be going to the gym up there. Reviewed THR, pulse, RPE, sign and symptoms, NTG use, and when to call 911 or MD.  Also discussed weather considerations and indoor options.  Pt voiced understanding.  Dr. Emily Filbert is Medical Director for [place] and [facility].

## 2017-10-16 DIAGNOSIS — J4540 Moderate persistent asthma, uncomplicated: Secondary | ICD-10-CM

## 2017-10-16 NOTE — Progress Notes (Signed)
Pulmonary Individual Treatment Plan  Patient Details  Name: Kim Owen  MRN: [id number] Date of Birth: Apr 26, 1935 Referring Provider:     Pulmonary Rehab from 09/18/2017 in [facility] Cardiac and Pulmonary Rehab  Referring Provider  Thad Ranger MD      Initial Encounter Date:    Pulmonary Rehab from 09/18/2017 in [facility] Cardiac and Pulmonary Rehab  Date  09/18/17  Referring Provider  Thad Ranger MD      Visit Diagnosis: Moderate persistent asthma without complication  Patient's Home Medications on Admission:  Current Outpatient Medications:  .  albuterol (PROVENTIL HFA;VENTOLIN HFA) 108 (90 Base) MCG/ACT inhaler, Inhale 2 puffs into the lungs every 6 (six) hours as needed for wheezing or shortness of breath., Disp: , Rfl:  .  amLODipine (NORVASC) 5 MG tablet, Take 5 mg by mouth daily., Disp: , Rfl:  .  azelastine (ASTELIN) 0.1 % nasal spray, Place 2 sprays into both nostrils 2 (two) times daily. Use in each nostril as directed, Disp: , Rfl:  .  diclofenac sodium (VOLTAREN) 1 % GEL, Apply 2 g topically 4 (four) times daily., Disp: , Rfl:  .  esomeprazole (NEXIUM) 40 MG capsule, Take 40 mg by mouth daily at 12 noon., Disp: , Rfl:  .  Fluticasone-Salmeterol (ADVAIR) 250-50 MCG/DOSE AEPB, Inhale 1 puff into the lungs 2 (two) times daily., Disp: , Rfl:  .  HYDROcodone-acetaminophen (NORCO/VICODIN) 5-325 MG tablet, 1-2 tabs po qd prn severe pain, Disp: 6 tablet, Rfl: 0 .  levothyroxine (SYNTHROID, LEVOTHROID) 25 MCG tablet, Take 25 mcg by mouth daily before breakfast., Disp: , Rfl:  .  MOMETASONE FUROATE NA, Place into the nose., Disp: , Rfl:  .  montelukast (SINGULAIR) 10 MG tablet, Take 10 mg by mouth at bedtime., Disp: , Rfl:  .  predniSONE (DELTASONE) 20 MG tablet, 2 tabs po qd for 2 days, then 1 tab po qd for 2 days, then half at tab po qd for 2 days, Disp: 7 tablet, Rfl: 0 .  SUMAtriptan (IMITREX) 50 MG tablet, Take 50 mg by mouth every 2 (two) hours as needed for migraine. May  repeat in 2 hours if headache persists or recurs., Disp: , Rfl:  .  tiotropium (SPIRIVA) 18 MCG inhalation capsule, Place 18 mcg into inhaler and inhale daily., Disp: , Rfl:  .  topiramate (TOPAMAX) 50 MG tablet, Take 50 mg by mouth 2 (two) times daily., Disp: , Rfl:   Past Medical History: Past Medical History:  Diagnosis Date  . Asthma   . Barrett esophagus   . Cataract   . Colitis   . Fatty liver   . GERD (gastroesophageal reflux disease)   . Herpes zoster   . Hypertension   . PVD (peripheral vascular disease) ([place])     Tobacco Use: Social History   Tobacco Use  Smoking Status Never Smoker  Smokeless Tobacco Never Used    Labs: Recent Review Flowsheet Data    There is no flowsheet data to display.       Pulmonary Assessment Scores: Pulmonary Assessment Scores    Row Name 09/18/17 1521         ADL UCSD   ADL Phase  Entry     SOB Score total  93     Rest  2     Walk  3     Stairs  5     Bath  4     Dress  4     Shop  4  CAT Score   CAT Score  26        Pulmonary Function Assessment: Pulmonary Function Assessment - 09/18/17 1523      Breath   Bilateral Breath Sounds  Clear    Shortness of Breath  Limiting activity;Yes       Exercise Target Goals:    Exercise Program Goal: Individual exercise prescription set using results from initial 6 min walk test and THRR while considering  patient's activity barriers and safety.    Exercise Prescription Goal: Initial exercise prescription builds to 30-45 minutes a day of aerobic activity, 2-3 days per week.  Home exercise guidelines will be given to patient during program as part of exercise prescription that the participant will acknowledge.  Activity Barriers & Risk Stratification: Activity Barriers & Cardiac Risk Stratification - 09/18/17 1613      Activity Barriers & Cardiac Risk Stratification   Activity Barriers  Muscular Weakness;Deconditioning;Shortness of Breath;Joint  Problems;Arthritis;Back Problems;Balance Concerns right rotator cuff tear, occasional back pain       6 Minute Walk: 6 Minute Walk    Row Name 09/18/17 1610         6 Minute Walk   Phase  Initial     Distance  930 feet     Walk Time  6 minutes     # of Rest Breaks  0     MPH  1.76     METS  1.98     RPE  11     Perceived Dyspnea   1     VO2 Peak  6.94     Symptoms  Yes (comment)     Comments  SOB, staggered gait     Resting HR  84 bpm     Resting BP  136/70     Resting Oxygen Saturation   98 %     Exercise Oxygen Saturation  during 6 min walk  98 %     Max Ex. HR  110 bpm     Max Ex. BP  134/68     2 Minute Post BP  132/64       Interval HR   1 Minute HR  103     2 Minute HR  107     3 Minute HR  110     4 Minute HR  107     5 Minute HR  106     6 Minute HR  106     2 Minute Post HR  93     Interval Heart Rate?  Yes       Interval Oxygen   Interval Oxygen?  Yes     Baseline Oxygen Saturation %  98 %     1 Minute Oxygen Saturation %  98 %     1 Minute Liters of Oxygen  0 L Room Air     2 Minute Oxygen Saturation %  98 %     2 Minute Liters of Oxygen  0 L     3 Minute Oxygen Saturation %  98 %     3 Minute Liters of Oxygen  0 L     4 Minute Oxygen Saturation %  98 %     4 Minute Liters of Oxygen  0 L     5 Minute Oxygen Saturation %  98 %     5 Minute Liters of Oxygen  0 L     6 Minute Oxygen Saturation %  99 %  6 Minute Liters of Oxygen  0 L     2 Minute Post Oxygen Saturation %  99 %     2 Minute Post Liters of Oxygen  0 L       Oxygen Initial Assessment: Oxygen Initial Assessment - 09/18/17 1526      Home Oxygen   Home Oxygen Device  None    Sleep Oxygen Prescription  None;CPAP never owned or used a CPAP    Home Exercise Oxygen Prescription  None    Home at Rest Exercise Oxygen Prescription  None    Compliance with Home Oxygen Use  No    Comments  -- does not wear CPAP      Initial 6 min Walk   Oxygen Used  None      Program Oxygen  Prescription   Program Oxygen Prescription  None      Intervention   Short Term Goals  To learn and demonstrate proper use of respiratory medications;To learn and demonstrate proper pursed lip breathing techniques or other breathing techniques.;To learn and understand importance of maintaining oxygen saturations>88%;To learn and understand importance of monitoring SPO2 with pulse oximeter and demonstrate accurate use of the pulse oximeter.    Long  Term Goals  Demonstrates proper use of MDI's;Compliance with respiratory medication;Exhibits proper breathing techniques, such as pursed lip breathing or other method taught during program session;Maintenance of O2 saturations>88%;Verbalizes importance of monitoring SPO2 with pulse oximeter and return demonstration       Oxygen Re-Evaluation: Oxygen Re-Evaluation    Row Name 09/25/17 1138             Goals/Expected Outcomes   Short Term Goals  To learn and demonstrate proper use of respiratory medications;To learn and demonstrate proper pursed lip breathing techniques or other breathing techniques.;To learn and understand importance of maintaining oxygen saturations>88%;To learn and understand importance of monitoring SPO2 with pulse oximeter and demonstrate accurate use of the pulse oximeter.       Long  Term Goals  Demonstrates proper use of MDI's;Compliance with respiratory medication;Exhibits proper breathing techniques, such as pursed lip breathing or other method taught during program session;Maintenance of O2 saturations>88%;Verbalizes importance of monitoring SPO2 with pulse oximeter and return demonstration       Comments  Reviewed PLB technique with pt.  Talked about how it work and it's important to maintaining his exercise saturations.         Goals/Expected Outcomes  Short: Become more profiecient at using PLB.   Long: Become independent at using PLB.          Oxygen Discharge (Final Oxygen Re-Evaluation): Oxygen Re-Evaluation -  09/25/17 1138      Goals/Expected Outcomes   Short Term Goals  To learn and demonstrate proper use of respiratory medications;To learn and demonstrate proper pursed lip breathing techniques or other breathing techniques.;To learn and understand importance of maintaining oxygen saturations>88%;To learn and understand importance of monitoring SPO2 with pulse oximeter and demonstrate accurate use of the pulse oximeter.    Long  Term Goals  Demonstrates proper use of MDI's;Compliance with respiratory medication;Exhibits proper breathing techniques, such as pursed lip breathing or other method taught during program session;Maintenance of O2 saturations>88%;Verbalizes importance of monitoring SPO2 with pulse oximeter and return demonstration    Comments  Reviewed PLB technique with pt.  Talked about how it work and it's important to maintaining his exercise saturations.      Goals/Expected Outcomes  Short: Become more profiecient at using PLB.  Long: Become independent at using PLB.       Initial Exercise Prescription: Initial Exercise Prescription - 09/18/17 1600      Date of Initial Exercise RX and Referring Provider   Date  09/18/17    Referring Provider  Thad Ranger MD      Treadmill   MPH  1.5    Grade  0.5    Minutes  15    METs  2.25      NuStep   Level  1    SPM  80    Minutes  15    METs  2      Arm Ergometer   Level  1    Watts  22    RPM  25    Minutes  15    METs  2      Prescription Details   Frequency (times per week)  3    Duration  Progress to 45 minutes of aerobic exercise without signs/symptoms of physical distress      Intensity   THRR 40-80% of Max Heartrate  106-129    Ratings of Perceived Exertion  11-13    Perceived Dyspnea  0-4      Progression   Progression  Continue to progress workloads to maintain intensity without signs/symptoms of physical distress.      Resistance Training   Training Prescription  Yes    Weight  3 lbs    Reps  10-15        Perform Capillary Blood Glucose checks as needed.  Exercise Prescription Changes: Exercise Prescription Changes    Row Name 09/18/17 1600 10/04/17 1500 10/06/17 1200         Response to Exercise   Blood Pressure (Admit)  136/70  130/80  -     Blood Pressure (Exercise)  134/68  136/60  -     Blood Pressure (Exit)  132/64  122/62  -     Heart Rate (Admit)  84 bpm  92 bpm  -     Heart Rate (Exercise)  110 bpm  98 bpm  -     Heart Rate (Exit)  93 bpm  69 bpm  -     Oxygen Saturation (Admit)  98 %  99 %  -     Oxygen Saturation (Exercise)  98 %  98 %  -     Oxygen Saturation (Exit)  99 %  99 %  -     Rating of Perceived Exertion (Exercise)  11  13  -     Perceived Dyspnea (Exercise)  1  1  -     Symptoms  SOB, staggered gait  none  -     Comments  walk test results  -  -     Duration  -  Continue with 45 min of aerobic exercise without signs/symptoms of physical distress.  -     Intensity  -  THRR unchanged  -       Progression   Progression  -  Continue to progress workloads to maintain intensity without signs/symptoms of physical distress.  -     Average METs  -  2.07  -       Resistance Training   Training Prescription  -  Yes  -     Weight  -  3 lbs  -     Reps  -  10-15  -       Interval Training  Interval Training  -  No  -       Treadmill   MPH  -  1.5  -     Grade  -  0.5  -     Minutes  -  15  -     METs  -  2.25  -       NuStep   Level  -  2  -     SPM  -  86  -     Minutes  -  15  -     METs  -  2  -       Biostep-RELP   Level  -  2  -     SPM  -  45  -     Minutes  -  15  -     METs  -  2  -       Home Exercise Plan   Plans to continue exercise at  -  -  Home (comment) walking, gym in [place]     Frequency  -  -  Add 3 additional days to program exercise sessions.     Initial Home Exercises Provided  -  -  10/06/17        Exercise Comments: Exercise Comments    Row Name 09/25/17 1138           Exercise Comments  First full day of  exercise!  Patient was oriented to gym and equipment including functions, settings, policies, and procedures.  Patient's individual exercise prescription and treatment plan were reviewed.  All starting workloads were established based on the results of the 6 minute walk test done at initial orientation visit.  The plan for exercise progression was also introduced and progression will be customized based on patient's performance and goals.          Exercise Goals and Review: Exercise Goals    Row Name 09/18/17 1619             Exercise Goals   Increase Physical Activity  Yes       Intervention  Provide advice, education, support and counseling about physical activity/exercise needs.;Develop an individualized exercise prescription for aerobic and resistive training based on initial evaluation findings, risk stratification, comorbidities and participant's personal goals.       Expected Outcomes  Achievement of increased cardiorespiratory fitness and enhanced flexibility, muscular endurance and strength shown through measurements of functional capacity and personal statement of participant.       Increase Strength and Stamina  Yes       Intervention  Provide advice, education, support and counseling about physical activity/exercise needs.;Develop an individualized exercise prescription for aerobic and resistive training based on initial evaluation findings, risk stratification, comorbidities and participant's personal goals.       Expected Outcomes  Achievement of increased cardiorespiratory fitness and enhanced flexibility, muscular endurance and strength shown through measurements of functional capacity and personal statement of participant.       Able to understand and use rate of perceived exertion (RPE) scale  Yes       Intervention  Provide education and explanation on how to use RPE scale       Expected Outcomes  Short Term: Able to use RPE daily in rehab to express subjective intensity  level;Long Term:  Able to use RPE to guide intensity level when exercising independently       Able to understand and use Dyspnea scale  Yes       Intervention  Provide education and explanation on how to use Dyspnea scale       Expected Outcomes  Short Term: Able to use Dyspnea scale daily in rehab to express subjective sense of shortness of breath during exertion;Long Term: Able to use Dyspnea scale to guide intensity level when exercising independently       Knowledge and understanding of Target Heart Rate Range (THRR)  Yes       Intervention  Provide education and explanation of THRR including how the numbers were predicted and where they are located for reference       Expected Outcomes  Short Term: Able to state/look up THRR;Long Term: Able to use THRR to govern intensity when exercising independently;Short Term: Able to use daily as guideline for intensity in rehab       Able to check pulse independently  Yes       Intervention  Provide education and demonstration on how to check pulse in carotid and radial arteries.;Review the importance of being able to check your own pulse for safety during independent exercise       Expected Outcomes  Short Term: Able to explain why pulse checking is important during independent exercise;Long Term: Able to check pulse independently and accurately       Understanding of Exercise Prescription  Yes       Intervention  Provide education, explanation, and written materials on patient's individual exercise prescription       Expected Outcomes  Short Term: Able to explain program exercise prescription;Long Term: Able to explain home exercise prescription to exercise independently          Exercise Goals Re-Evaluation : Exercise Goals Re-Evaluation    Row Name 09/25/17 1138 10/04/17 1515 10/06/17 1228         Exercise Goal Re-Evaluation   Exercise Goals Review  Understanding of Exercise Prescription;Able to understand and use Dyspnea scale;Knowledge and  understanding of Target Heart Rate Range (THRR);Able to understand and use rate of perceived exertion (RPE) scale  Increase Physical Activity;Understanding of Exercise Prescription;Increase Strength and Stamina  Understanding of Exercise Prescription;Increase Physical Activity;Increase Strength and Stamina;Able to understand and use Dyspnea scale;Knowledge and understanding of Target Heart Rate Range (THRR);Able to understand and use rate of perceived exertion (RPE) scale;Able to check pulse independently     Comments  Reviewed RPE scale, THR and program prescription with pt today.  Pt voiced understanding and was given a copy of goals to take home.   Ladene is off to a good start in rehab.  She is already up to level 2 on the BioStep and NuStep.  We will continue to monitor her progression.   Reviewed home exercise with pt today.  Pt plans to walk at home for exercise. She is going to be in [place] for the next two week and will be going to the gym up there. Reviewed THR, pulse, RPE, sign and symptoms, NTG use, and when to call 911 or MD.  Also discussed weather considerations and indoor options.  Pt voiced understanding.     Expected Outcomes  Short: Use RPE daily to regulate intensity.  Long: Follow program prescription in THR.  Short: Continue to attend rehab regularly.  Long: Continue to increase physcial activity.   Short: Go to gym while in [place].  Long: Continue to exercise more at home as she is only attending two days a week.         Discharge  Exercise Prescription (Final Exercise Prescription Changes): Exercise Prescription Changes - 10/06/17 1200      Home Exercise Plan   Plans to continue exercise at  Home (comment) walking, gym in [place]    Frequency  Add 3 additional days to program exercise sessions.    Initial Home Exercises Provided  10/06/17       Nutrition:  Target Goals: Understanding of nutrition guidelines, daily intake of sodium <1562m, cholesterol <2017m calories  30% from fat and 7% or less from saturated fats, daily to have 5 or more servings of fruits and vegetables.  Biometrics: Pre Biometrics - 09/18/17 1620      Pre Biometrics   Height  '5\' 6"'  (1.676 m)    Weight  151 lb 8 oz (68.7 kg)    Waist Circumference  36 inches    Hip Circumference  40 inches    Waist to Hip Ratio  0.9 %    BMI (Calculated)  24.46        Nutrition Therapy Plan and Nutrition Goals: Nutrition Therapy & Goals - 09/25/17 1213      Nutrition Therapy   Diet  TLC    Protein (specify units)  6oz    Fiber  25 grams    Saturated Fats  12 max. grams    Fruits and Vegetables  6 servings/day    Sodium  2000 grams      Personal Nutrition Goals   Nutrition Goal  Consume more dietary sources of protein    Personal Goal #2  Aim to eat more balanced meals; IE a vegetable, carb and protein source together    Personal Goal #3  Continue to use salt sparingly      Intervention Plan   Intervention  Prescribe, educate and counsel regarding individualized specific dietary modifications aiming towards targeted core components such as weight, hypertension, lipid management, diabetes, heart failure and other comorbidities.    Expected Outcomes  Short Term Goal: Understand basic principles of dietary content, such as calories, fat, sodium, cholesterol and nutrients.;Short Term Goal: A plan has been developed with personal nutrition goals set during dietitian appointment.;Long Term Goal: Adherence to prescribed nutrition plan.       Nutrition Assessments: Nutrition Assessments - 09/18/17 1524      MEDFICTS Scores   Pre Score  34       Nutrition Goals Re-Evaluation:   Nutrition Goals Discharge (Final Nutrition Goals Re-Evaluation):   Psychosocial: Target Goals: Acknowledge presence or absence of significant depression and/or stress, maximize coping skills, provide positive support system. Participant is able to verbalize types and ability to use techniques and skills needed  for reducing stress and depression.   Initial Review & Psychosocial Screening: Initial Psych Review & Screening - 09/18/17 1515      Initial Review   Current issues with  Current Depression;Current Sleep Concerns;Current Stress Concerns    Source of Stress Concerns  Chronic Illness;Family;Unable to perform yard/household activities;Unable to participate in former interests or hobbies    Comments  Her son has been arrested for hugging people and has made a big impact on her and her family. She is also stressed because she cannot do some of the things she used to.       Family Dynamics   Good Support System?  Yes    Comments  Her son, daughter in law and daughter.      Barriers   Psychosocial barriers to participate in program  The patient should benefit from training in  stress management and relaxation.      Screening Interventions   Interventions  Yes;Encouraged to exercise;Program counselor consult;Provide feedback about the scores to participant;To provide support and resources with identified psychosocial needs    Expected Outcomes  Long Term goal: The participant improves quality of Life and PHQ9 Scores as seen by post scores and/or verbalization of changes;Short Term goal: Identification and review with participant of any Quality of Life or Depression concerns found by scoring the questionnaire.;Long Term Goal: Stressors or current issues are controlled or eliminated.;Short Term goal: Utilizing psychosocial counselor, staff and physician to assist with identification of specific Stressors or current issues interfering with healing process. Setting desired goal for each stressor or current issue identified.       Quality of Life Scores:  Scores of 19 and below usually indicate a poorer quality of life in these areas.  A difference of  2-3 points is a clinically meaningful difference.  A difference of 2-3 points in the total score of the Quality of Life Index has been associated with  significant improvement in overall quality of life, self-image, physical symptoms, and general health in studies assessing change in quality of life.  PHQ-9: Recent Review Flowsheet Data    Depression screen [facility] 2/9 09/18/2017   Decreased Interest 3   Down, Depressed, Hopeless 2   PHQ - 2 Score 5   Altered sleeping 2   Tired, decreased energy 3   Change in appetite 0   Feeling bad or failure about yourself  1   Trouble concentrating 1   Moving slowly or fidgety/restless 1   Suicidal thoughts 0   PHQ-9 Score 13   Difficult doing work/chores Somewhat difficult     Interpretation of Total Score  Total Score Depression Severity:  1-4 = Minimal depression, 5-9 = Mild depression, 10-14 = Moderate depression, 15-19 = Moderately severe depression, 20-27 = Severe depression   Psychosocial Evaluation and Intervention:   Psychosocial Re-Evaluation:   Psychosocial Discharge (Final Psychosocial Re-Evaluation):   Education: Education Goals: Education classes will be provided on a weekly basis, covering required topics. Participant will state understanding/return demonstration of topics presented.  Learning Barriers/Preferences: Learning Barriers/Preferences - 09/18/17 1526      Learning Barriers/Preferences   Learning Barriers  Sight wears reading glasses    Learning Preferences  None       Education Topics:  Initial Evaluation Education: - Verbal, written and demonstration of respiratory meds, oximetry and breathing techniques. Instruction on use of nebulizers and MDIs and importance of monitoring MDI activations.   Pulmonary Rehab from 10/02/2017 in [facility] Cardiac and Pulmonary Rehab  Date  09/18/17  Educator  [facility]  Instruction Review Code  1- Verbalizes Understanding      General Nutrition Guidelines/Fats and Fiber: -Group instruction provided by verbal, written material, models and posters to present the general guidelines for heart healthy nutrition. Gives an explanation and  review of dietary fats and fiber.   Pulmonary Rehab from 10/02/2017 in [facility] Cardiac and Pulmonary Rehab  Date  10/02/17  Educator  CR  Instruction Review Code  1- Verbalizes Understanding      Controlling Sodium/Reading Food Labels: -Group verbal and written material supporting the discussion of sodium use in heart healthy nutrition. Review and explanation with models, verbal and written materials for utilization of the food label.   Exercise Physiology & General Exercise Guidelines: - Group verbal and written instruction with models to review the exercise physiology of the cardiovascular system and associated critical values.  Provides general exercise guidelines with specific guidelines to those with heart or lung disease.    Aerobic Exercise & Resistance Training: - Gives group verbal and written instruction on the various components of exercise. Focuses on aerobic and resistive training programs and the benefits of this training and how to safely progress through these programs.   Flexibility, Balance, Mind/Body Relaxation: Provides group verbal/written instruction on the benefits of flexibility and balance training, including mind/body exercise modes such as yoga, pilates and tai chi.  Demonstration and skill practice provided.   Stress and Anxiety: - Provides group verbal and written instruction about the health risks of elevated stress and causes of high stress.  Discuss the correlation between heart/lung disease and anxiety and treatment options. Review healthy ways to manage with stress and anxiety.   Depression: - Provides group verbal and written instruction on the correlation between heart/lung disease and depressed mood, treatment options, and the stigmas associated with seeking treatment.   Exercise & Equipment Safety: - Individual verbal instruction and demonstration of equipment use and safety with use of the equipment.   Pulmonary Rehab from 10/02/2017 in [facility] Cardiac  and Pulmonary Rehab  Date  09/18/17  Educator  [facility]  Instruction Review Code  1- Verbalizes Understanding      Infection Prevention: - Provides verbal and written material to individual with discussion of infection control including proper hand washing and proper equipment cleaning during exercise session.   Pulmonary Rehab from 10/02/2017 in [facility] Cardiac and Pulmonary Rehab  Date  09/18/17  Educator  [facility]  Instruction Review Code  1- Verbalizes Understanding      Falls Prevention: - Provides verbal and written material to individual with discussion of falls prevention and safety.   Pulmonary Rehab from 10/02/2017 in [facility] Cardiac and Pulmonary Rehab  Date  09/18/17  Educator  [facility]  Instruction Review Code  1- Verbalizes Understanding      Diabetes: - Individual verbal and written instruction to review signs/symptoms of diabetes, desired ranges of glucose level fasting, after meals and with exercise. Advice that pre and post exercise glucose checks will be done for 3 sessions at entry of program.   Chronic Lung Diseases: - Group verbal and written instruction to review updates, respiratory medications, advancements in procedures and treatments. Discuss use of supplemental oxygen including available portable oxygen systems, continuous and intermittent flow rates, concentrators, personal use and safety guidelines. Review proper use of inhaler and spacers. Provide informative websites for self-education.    Energy Conservation: - Provide group verbal and written instruction for methods to conserve energy, plan and organize activities. Instruct on pacing techniques, use of adaptive equipment and posture/positioning to relieve shortness of breath.   Triggers and Exacerbations: - Group verbal and written instruction to review types of environmental triggers and ways to prevent exacerbations. Discuss weather changes, air quality and the benefits of nasal washing. Review warning signs and  symptoms to help prevent infections. Discuss techniques for effective airway clearance, coughing, and vibrations.   AED/CPR: - Group verbal and written instruction with the use of models to demonstrate the basic use of the AED with the basic ABC's of resuscitation.   Anatomy and Physiology of the Lungs: - Group verbal and written instruction with the use of models to provide basic lung anatomy and physiology related to function, structure and complications of lung disease.   Anatomy & Physiology of the Heart: - Group verbal and written instruction and models provide basic cardiac anatomy and physiology, with the coronary  electrical and arterial systems. Review of Valvular disease and Heart Failure   Cardiac Medications: - Group verbal and written instruction to review commonly prescribed medications for heart disease. Reviews the medication, class of the drug, and side effects.   Know Your Numbers and Risk Factors: -Group verbal and written instruction about important numbers in your health.  Discussion of what are risk factors and how they play a role in the disease process.  Review of Cholesterol, Blood Pressure, Diabetes, and BMI and the role they play in your overall health.   Sleep Hygiene: -Provides group verbal and written instruction about how sleep can affect your health.  Define sleep hygiene, discuss sleep cycles and impact of sleep habits. Review good sleep hygiene tips.    Other: -Provides group and verbal instruction on various topics (see comments)   Pulmonary Rehab from 10/02/2017 in [facility] Cardiac and Pulmonary Rehab  Date  09/27/17  Educator  [facility]  Instruction Review Code  1- Verbalizes Understanding       Knowledge Questionnaire Score: Knowledge Questionnaire Score - 09/18/17 1523      Knowledge Questionnaire Score   Pre Score  14/18 reviewed with patient        Core Components/Risk Factors/Patient Goals at Admission: Personal Goals and Risk Factors at  Admission - 09/18/17 1528      Core Components/Risk Factors/Patient Goals on Admission    Weight Management  Yes;Weight Loss    Intervention  Weight Management: Develop a combined nutrition and exercise program designed to reach desired caloric intake, while maintaining appropriate intake of nutrient and fiber, sodium and fats, and appropriate energy expenditure required for the weight goal.;Weight Management: Provide education and appropriate resources to help participant work on and attain dietary goals.;Weight Management/Obesity: Establish reasonable short term and long term weight goals.    Admit Weight  151 lb 8 oz (68.7 kg)    Goal Weight: Short Term  145 lb (65.8 kg)    Goal Weight: Long Term  145 lb (65.8 kg)    Expected Outcomes  Short Term: Continue to assess and modify interventions until short term weight is achieved;Long Term: Adherence to nutrition and physical activity/exercise program aimed toward attainment of established weight goal;Weight Loss: Understanding of general recommendations for a balanced deficit meal plan, which promotes 1-2 lb weight loss per week and includes a negative energy balance of [phone] kcal/d;Understanding recommendations for meals to include 15-35% energy as protein, 25-35% energy from fat, 35-60% energy from carbohydrates, less than 225m of dietary cholesterol, 20-35 gm of total fiber daily;Understanding of distribution of calorie intake throughout the day with the consumption of 4-5 meals/snacks    Improve shortness of breath with ADL's  Yes    Intervention  Provide education, individualized exercise plan and daily activity instruction to help decrease symptoms of SOB with activities of daily living.    Expected Outcomes  Short Term: Achieves a reduction of symptoms when performing activities of daily living.    Hypertension  Yes takes medication    Intervention  Provide education on lifestyle modifcations including regular physical activity/exercise,  weight management, moderate sodium restriction and increased consumption of fresh fruit, vegetables, and low fat dairy, alcohol moderation, and smoking cessation.;Monitor prescription use compliance.    Expected Outcomes  Short Term: Continued assessment and intervention until BP is < 140/915mHG in hypertensive participants. < 130/8012mG in hypertensive participants with diabetes, heart failure or chronic kidney disease.;Long Term: Maintenance of blood pressure at goal levels.  Core Components/Risk Factors/Patient Goals Review:    Core Components/Risk Factors/Patient Goals at Discharge (Final Review):    ITP Comments: ITP Comments    Row Name 09/18/17 1456 10/16/17 0929         ITP Comments  Medical Evaluation completed. Chart sent for review and changes to Dr. Emily Filbert Director of [facility]. Diagnosis can be found in [facility] encounter 09/18/17   Keshayla has been out of class due to a family emergency and states she will be back in 2 weeks. 30 day review completed. ITP sent to Dr. Emily Filbert Director of [facility]. Continue with ITP unless changes are made by physician.         Comments: 30 day review

## 2017-10-31 ENCOUNTER — Telehealth: Payer: Self-pay

## 2017-10-31 DIAGNOSIS — J4540 Moderate persistent asthma, uncomplicated: Secondary | ICD-10-CM

## 2017-10-31 NOTE — Telephone Encounter (Signed)
Patient has not attended class recently. Called to check up on patient. Informed her to call if she wants to continue [facility].

## 2017-11-02 ENCOUNTER — Telehealth: Payer: Self-pay

## 2017-11-02 NOTE — Telephone Encounter (Signed)
2nd call to patient with no answer. Left message for patient to call.

## 2017-11-03 ENCOUNTER — Encounter: Payer: Medicare Other | Attending: Pulmonary Disease

## 2017-11-03 DIAGNOSIS — I739 Peripheral vascular disease, unspecified: Secondary | ICD-10-CM | POA: Insufficient documentation

## 2017-11-03 DIAGNOSIS — J4540 Moderate persistent asthma, uncomplicated: Secondary | ICD-10-CM | POA: Insufficient documentation

## 2017-11-03 DIAGNOSIS — K219 Gastro-esophageal reflux disease without esophagitis: Secondary | ICD-10-CM | POA: Insufficient documentation

## 2017-11-03 DIAGNOSIS — Z79899 Other long term (current) drug therapy: Secondary | ICD-10-CM | POA: Insufficient documentation

## 2017-11-03 DIAGNOSIS — I10 Essential (primary) hypertension: Secondary | ICD-10-CM | POA: Insufficient documentation

## 2017-11-06 ENCOUNTER — Telehealth: Payer: Self-pay

## 2017-11-06 DIAGNOSIS — J4540 Moderate persistent asthma, uncomplicated: Secondary | ICD-10-CM

## 2017-11-06 NOTE — Telephone Encounter (Signed)
Kim Owen states that she will be back on Friday March 8th. [place].

## 2017-11-09 ENCOUNTER — Telehealth: Payer: Self-pay | Admitting: *Deleted

## 2017-11-09 ENCOUNTER — Encounter: Payer: Self-pay | Admitting: *Deleted

## 2017-11-09 DIAGNOSIS — J4540 Moderate persistent asthma, uncomplicated: Secondary | ICD-10-CM

## 2017-11-09 NOTE — Progress Notes (Signed)
Pulmonary Individual Treatment Plan  Patient Details  Name: KALANDRA MASTERS MRN: [id number] Date of Birth: 10/19/1934 Referring Provider:     Pulmonary Rehab from 09/18/2017 in [facility] Cardiac and Pulmonary Rehab  Referring Provider  Thad Ranger MD      Initial Encounter Date:    Pulmonary Rehab from 09/18/2017 in [facility] Cardiac and Pulmonary Rehab  Date  09/18/17  Referring Provider  Thad Ranger MD      Visit Diagnosis: Moderate persistent asthma without complication  Patient's Home Medications on Admission:  Current Outpatient Medications:  .  albuterol (PROVENTIL HFA;VENTOLIN HFA) 108 (90 Base) MCG/ACT inhaler, Inhale 2 puffs into the lungs every 6 (six) hours as needed for wheezing or shortness of breath., Disp: , Rfl:  .  amLODipine (NORVASC) 5 MG tablet, Take 5 mg by mouth daily., Disp: , Rfl:  .  azelastine (ASTELIN) 0.1 % nasal spray, Place 2 sprays into both nostrils 2 (two) times daily. Use in each nostril as directed, Disp: , Rfl:  .  diclofenac sodium (VOLTAREN) 1 % GEL, Apply 2 g topically 4 (four) times daily., Disp: , Rfl:  .  esomeprazole (NEXIUM) 40 MG capsule, Take 40 mg by mouth daily at 12 noon., Disp: , Rfl:  .  Fluticasone-Salmeterol (ADVAIR) 250-50 MCG/DOSE AEPB, Inhale 1 puff into the lungs 2 (two) times daily., Disp: , Rfl:  .  HYDROcodone-acetaminophen (NORCO/VICODIN) 5-325 MG tablet, 1-2 tabs po qd prn severe pain, Disp: 6 tablet, Rfl: 0 .  levothyroxine (SYNTHROID, LEVOTHROID) 25 MCG tablet, Take 25 mcg by mouth daily before breakfast., Disp: , Rfl:  .  MOMETASONE FUROATE NA, Place into the nose., Disp: , Rfl:  .  montelukast (SINGULAIR) 10 MG tablet, Take 10 mg by mouth at bedtime., Disp: , Rfl:  .  predniSONE (DELTASONE) 20 MG tablet, 2 tabs po qd for 2 days, then 1 tab po qd for 2 days, then half at tab po qd for 2 days, Disp: 7 tablet, Rfl: 0 .  SUMAtriptan (IMITREX) 50 MG tablet, Take 50 mg by mouth every 2 (two) hours as needed for migraine. May  repeat in 2 hours if headache persists or recurs., Disp: , Rfl:  .  tiotropium (SPIRIVA) 18 MCG inhalation capsule, Place 18 mcg into inhaler and inhale daily., Disp: , Rfl:  .  topiramate (TOPAMAX) 50 MG tablet, Take 50 mg by mouth 2 (two) times daily., Disp: , Rfl:   Past Medical History: Past Medical History:  Diagnosis Date  . Asthma   . Barrett esophagus   . Cataract   . Colitis   . Fatty liver   . GERD (gastroesophageal reflux disease)   . Herpes zoster   . Hypertension   . PVD (peripheral vascular disease) ([place])     Tobacco Use: Social History   Tobacco Use  Smoking Status Never Smoker  Smokeless Tobacco Never Used    Labs: Recent Review Flowsheet Data    There is no flowsheet data to display.       Pulmonary Assessment Scores: Pulmonary Assessment Scores    Row Name 09/18/17 1521 10/31/17 1505       ADL UCSD   ADL Phase  Entry  Entry    SOB Score total  93  -    Rest  2  -    Walk  3  -    Stairs  5  -    Bath  4  -    Dress  4  -  Shop  4  -      CAT Score   CAT Score  26  -      mMRC Score   mMRC Score  -  3       Pulmonary Function Assessment: Pulmonary Function Assessment - 09/18/17 1523      Breath   Bilateral Breath Sounds  Clear    Shortness of Breath  Limiting activity;Yes       Exercise Target Goals:    Exercise Program Goal: Individual exercise prescription set using results from initial 6 min walk test and THRR while considering  patient's activity barriers and safety.    Exercise Prescription Goal: Initial exercise prescription builds to 30-45 minutes a day of aerobic activity, 2-3 days per week.  Home exercise guidelines will be given to patient during program as part of exercise prescription that the participant will acknowledge.  Activity Barriers & Risk Stratification: Activity Barriers & Cardiac Risk Stratification - 09/18/17 1613      Activity Barriers & Cardiac Risk Stratification   Activity Barriers  Muscular  Weakness;Deconditioning;Shortness of Breath;Joint Problems;Arthritis;Back Problems;Balance Concerns right rotator cuff tear, occasional back pain       6 Minute Walk: 6 Minute Walk    Row Name 09/18/17 1610         6 Minute Walk   Phase  Initial     Distance  930 feet     Walk Time  6 minutes     # of Rest Breaks  0     MPH  1.76     METS  1.98     RPE  11     Perceived Dyspnea   1     VO2 Peak  6.94     Symptoms  Yes (comment)     Comments  SOB, staggered gait     Resting HR  84 bpm     Resting BP  136/70     Resting Oxygen Saturation   98 %     Exercise Oxygen Saturation  during 6 min walk  98 %     Max Ex. HR  110 bpm     Max Ex. BP  134/68     2 Minute Post BP  132/64       Interval HR   1 Minute HR  103     2 Minute HR  107     3 Minute HR  110     4 Minute HR  107     5 Minute HR  106     6 Minute HR  106     2 Minute Post HR  93     Interval Heart Rate?  Yes       Interval Oxygen   Interval Oxygen?  Yes     Baseline Oxygen Saturation %  98 %     1 Minute Oxygen Saturation %  98 %     1 Minute Liters of Oxygen  0 L Room Air     2 Minute Oxygen Saturation %  98 %     2 Minute Liters of Oxygen  0 L     3 Minute Oxygen Saturation %  98 %     3 Minute Liters of Oxygen  0 L     4 Minute Oxygen Saturation %  98 %     4 Minute Liters of Oxygen  0 L     5 Minute Oxygen Saturation %  98 %  5 Minute Liters of Oxygen  0 L     6 Minute Oxygen Saturation %  99 %     6 Minute Liters of Oxygen  0 L     2 Minute Post Oxygen Saturation %  99 %     2 Minute Post Liters of Oxygen  0 L       Oxygen Initial Assessment: Oxygen Initial Assessment - 09/18/17 1526      Home Oxygen   Home Oxygen Device  None    Sleep Oxygen Prescription  None;CPAP never owned or used a CPAP    Home Exercise Oxygen Prescription  None    Home at Rest Exercise Oxygen Prescription  None    Compliance with Home Oxygen Use  No    Comments  -- does not wear CPAP      Initial 6 min Walk    Oxygen Used  None      Program Oxygen Prescription   Program Oxygen Prescription  None      Intervention   Short Term Goals  To learn and demonstrate proper use of respiratory medications;To learn and demonstrate proper pursed lip breathing techniques or other breathing techniques.;To learn and understand importance of maintaining oxygen saturations>88%;To learn and understand importance of monitoring SPO2 with pulse oximeter and demonstrate accurate use of the pulse oximeter.    Long  Term Goals  Demonstrates proper use of MDI's;Compliance with respiratory medication;Exhibits proper breathing techniques, such as pursed lip breathing or other method taught during program session;Maintenance of O2 saturations>88%;Verbalizes importance of monitoring SPO2 with pulse oximeter and return demonstration       Oxygen Re-Evaluation: Oxygen Re-Evaluation    Row Name 09/25/17 1138             Goals/Expected Outcomes   Short Term Goals  To learn and demonstrate proper use of respiratory medications;To learn and demonstrate proper pursed lip breathing techniques or other breathing techniques.;To learn and understand importance of maintaining oxygen saturations>88%;To learn and understand importance of monitoring SPO2 with pulse oximeter and demonstrate accurate use of the pulse oximeter.       Long  Term Goals  Demonstrates proper use of MDI's;Compliance with respiratory medication;Exhibits proper breathing techniques, such as pursed lip breathing or other method taught during program session;Maintenance of O2 saturations>88%;Verbalizes importance of monitoring SPO2 with pulse oximeter and return demonstration       Comments  Reviewed PLB technique with pt.  Talked about how it work and it's important to maintaining his exercise saturations.         Goals/Expected Outcomes  Short: Become more profiecient at using PLB.   Long: Become independent at using PLB.          Oxygen Discharge (Final Oxygen  Re-Evaluation): Oxygen Re-Evaluation - 09/25/17 1138      Goals/Expected Outcomes   Short Term Goals  To learn and demonstrate proper use of respiratory medications;To learn and demonstrate proper pursed lip breathing techniques or other breathing techniques.;To learn and understand importance of maintaining oxygen saturations>88%;To learn and understand importance of monitoring SPO2 with pulse oximeter and demonstrate accurate use of the pulse oximeter.    Long  Term Goals  Demonstrates proper use of MDI's;Compliance with respiratory medication;Exhibits proper breathing techniques, such as pursed lip breathing or other method taught during program session;Maintenance of O2 saturations>88%;Verbalizes importance of monitoring SPO2 with pulse oximeter and return demonstration    Comments  Reviewed PLB technique with pt.  Talked about how it work and  it's important to maintaining his exercise saturations.      Goals/Expected Outcomes  Short: Become more profiecient at using PLB.   Long: Become independent at using PLB.       Initial Exercise Prescription: Initial Exercise Prescription - 09/18/17 1600      Date of Initial Exercise RX and Referring Provider   Date  09/18/17    Referring Provider  Thad Ranger MD      Treadmill   MPH  1.5    Grade  0.5    Minutes  15    METs  2.25      NuStep   Level  1    SPM  80    Minutes  15    METs  2      Arm Ergometer   Level  1    Watts  22    RPM  25    Minutes  15    METs  2      Prescription Details   Frequency (times per week)  3    Duration  Progress to 45 minutes of aerobic exercise without signs/symptoms of physical distress      Intensity   THRR 40-80% of Max Heartrate  106-129    Ratings of Perceived Exertion  11-13    Perceived Dyspnea  0-4      Progression   Progression  Continue to progress workloads to maintain intensity without signs/symptoms of physical distress.      Resistance Training   Training Prescription   Yes    Weight  3 lbs    Reps  10-15       Perform Capillary Blood Glucose checks as needed.  Exercise Prescription Changes: Exercise Prescription Changes    Row Name 09/18/17 1600 10/04/17 1500 10/06/17 1200         Response to Exercise   Blood Pressure (Admit)  136/70  130/80  -     Blood Pressure (Exercise)  134/68  136/60  -     Blood Pressure (Exit)  132/64  122/62  -     Heart Rate (Admit)  84 bpm  92 bpm  -     Heart Rate (Exercise)  110 bpm  98 bpm  -     Heart Rate (Exit)  93 bpm  69 bpm  -     Oxygen Saturation (Admit)  98 %  99 %  -     Oxygen Saturation (Exercise)  98 %  98 %  -     Oxygen Saturation (Exit)  99 %  99 %  -     Rating of Perceived Exertion (Exercise)  11  13  -     Perceived Dyspnea (Exercise)  1  1  -     Symptoms  SOB, staggered gait  none  -     Comments  walk test results  -  -     Duration  -  Continue with 45 min of aerobic exercise without signs/symptoms of physical distress.  -     Intensity  -  THRR unchanged  -       Progression   Progression  -  Continue to progress workloads to maintain intensity without signs/symptoms of physical distress.  -     Average METs  -  2.07  -       Resistance Training   Training Prescription  -  Yes  -     Weight  -  3 lbs  -     Reps  -  10-15  -       Interval Training   Interval Training  -  No  -       Treadmill   MPH  -  1.5  -     Grade  -  0.5  -     Minutes  -  15  -     METs  -  2.25  -       NuStep   Level  -  2  -     SPM  -  86  -     Minutes  -  15  -     METs  -  2  -       Biostep-RELP   Level  -  2  -     SPM  -  45  -     Minutes  -  15  -     METs  -  2  -       Home Exercise Plan   Plans to continue exercise at  -  -  Home (comment) walking, gym in [place]     Frequency  -  -  Add 3 additional days to program exercise sessions.     Initial Home Exercises Provided  -  -  10/06/17        Exercise Comments: Exercise Comments    Row Name 09/25/17 1138            Exercise Comments  First full day of exercise!  Patient was oriented to gym and equipment including functions, settings, policies, and procedures.  Patient's individual exercise prescription and treatment plan were reviewed.  All starting workloads were established based on the results of the 6 minute walk test done at initial orientation visit.  The plan for exercise progression was also introduced and progression will be customized based on patient's performance and goals.          Exercise Goals and Review: Exercise Goals    Row Name 09/18/17 1619             Exercise Goals   Increase Physical Activity  Yes       Intervention  Provide advice, education, support and counseling about physical activity/exercise needs.;Develop an individualized exercise prescription for aerobic and resistive training based on initial evaluation findings, risk stratification, comorbidities and participant's personal goals.       Expected Outcomes  Achievement of increased cardiorespiratory fitness and enhanced flexibility, muscular endurance and strength shown through measurements of functional capacity and personal statement of participant.       Increase Strength and Stamina  Yes       Intervention  Provide advice, education, support and counseling about physical activity/exercise needs.;Develop an individualized exercise prescription for aerobic and resistive training based on initial evaluation findings, risk stratification, comorbidities and participant's personal goals.       Expected Outcomes  Achievement of increased cardiorespiratory fitness and enhanced flexibility, muscular endurance and strength shown through measurements of functional capacity and personal statement of participant.       Able to understand and use rate of perceived exertion (RPE) scale  Yes       Intervention  Provide education and explanation on how to use RPE scale       Expected Outcomes  Short Term: Able to use RPE daily in rehab  to express subjective intensity level;Long Term:  Able to use RPE to guide intensity level when exercising independently       Able to understand and use Dyspnea scale  Yes       Intervention  Provide education and explanation on how to use Dyspnea scale       Expected Outcomes  Short Term: Able to use Dyspnea scale daily in rehab to express subjective sense of shortness of breath during exertion;Long Term: Able to use Dyspnea scale to guide intensity level when exercising independently       Knowledge and understanding of Target Heart Rate Range (THRR)  Yes       Intervention  Provide education and explanation of THRR including how the numbers were predicted and where they are located for reference       Expected Outcomes  Short Term: Able to state/look up THRR;Long Term: Able to use THRR to govern intensity when exercising independently;Short Term: Able to use daily as guideline for intensity in rehab       Able to check pulse independently  Yes       Intervention  Provide education and demonstration on how to check pulse in carotid and radial arteries.;Review the importance of being able to check your own pulse for safety during independent exercise       Expected Outcomes  Short Term: Able to explain why pulse checking is important during independent exercise;Long Term: Able to check pulse independently and accurately       Understanding of Exercise Prescription  Yes       Intervention  Provide education, explanation, and written materials on patient's individual exercise prescription       Expected Outcomes  Short Term: Able to explain program exercise prescription;Long Term: Able to explain home exercise prescription to exercise independently          Exercise Goals Re-Evaluation : Exercise Goals Re-Evaluation    Row Name 09/25/17 1138 10/04/17 1515 10/06/17 1228 10/31/17 1250       Exercise Goal Re-Evaluation   Exercise Goals Review  Understanding of Exercise Prescription;Able to  understand and use Dyspnea scale;Knowledge and understanding of Target Heart Rate Range (THRR);Able to understand and use rate of perceived exertion (RPE) scale  Increase Physical Activity;Understanding of Exercise Prescription;Increase Strength and Stamina  Understanding of Exercise Prescription;Increase Physical Activity;Increase Strength and Stamina;Able to understand and use Dyspnea scale;Knowledge and understanding of Target Heart Rate Range (THRR);Able to understand and use rate of perceived exertion (RPE) scale;Able to check pulse independently  -    Comments  Reviewed RPE scale, THR and program prescription with pt today.  Pt voiced understanding and was given a copy of goals to take home.   [place] is off to a good start in rehab.  She is already up to level 2 on the BioStep and NuStep.  We will continue to monitor her progression.   Reviewed home exercise with pt today.  Pt plans to walk at home for exercise. She is going to be in [place] for the next two week and will be going to the gym up there. Reviewed THR, pulse, RPE, sign and symptoms, NTG use, and when to call 911 or MD.  Also discussed weather considerations and indoor options.  Pt voiced understanding.  Out since last review    Expected Outcomes  Short: Use RPE daily to regulate intensity.  Long: Follow program prescription in THR.  Short: Continue to attend rehab regularly.  Long: Continue to increase physcial activity.   Short: Go to  gym while in [place].  Long: Continue to exercise more at home as she is only attending two days a week.   -       Discharge Exercise Prescription (Final Exercise Prescription Changes): Exercise Prescription Changes - 10/06/17 1200      Home Exercise Plan   Plans to continue exercise at  Home (comment) walking, gym in [place]    Frequency  Add 3 additional days to program exercise sessions.    Initial Home Exercises Provided  10/06/17       Nutrition:  Target Goals: Understanding of nutrition  guidelines, daily intake of sodium <155m, cholesterol <204m calories 30% from fat and 7% or less from saturated fats, daily to have 5 or more servings of fruits and vegetables.  Biometrics: Pre Biometrics - 09/18/17 1620      Pre Biometrics   Height  _0  (1.676 m)    Weight  151 lb 8 oz (68.7 kg)    Waist Circumference  36 inches    Hip Circumference  40 inches    Waist to Hip Ratio  0.9 %    BMI (Calculated)  24.46        Nutrition Therapy Plan and Nutrition Goals: Nutrition Therapy & Goals - 09/25/17 1213      Nutrition Therapy   Diet  TLC    Protein (specify units)  6oz    Fiber  25 grams    Saturated Fats  12 max. grams    Fruits and Vegetables  6 servings/day    Sodium  2000 grams      Personal Nutrition Goals   Nutrition Goal  Consume more dietary sources of protein    Personal Goal #2  Aim to eat more balanced meals; IE a vegetable, carb and protein source together    Personal Goal #3  Continue to use salt sparingly      Intervention Plan   Intervention  Prescribe, educate and counsel regarding individualized specific dietary modifications aiming towards targeted core components such as weight, hypertension, lipid management, diabetes, heart failure and other comorbidities.    Expected Outcomes  Short Term Goal: Understand basic principles of dietary content, such as calories, fat, sodium, cholesterol and nutrients.;Short Term Goal: A plan has been developed with personal nutrition goals set during dietitian appointment.;Long Term Goal: Adherence to prescribed nutrition plan.       Nutrition Assessments: Nutrition Assessments - 09/18/17 1524      MEDFICTS Scores   Pre Score  34       Nutrition Goals Re-Evaluation:   Nutrition Goals Discharge (Final Nutrition Goals Re-Evaluation):   Psychosocial: Target Goals: Acknowledge presence or absence of significant depression and/or stress, maximize coping skills, provide positive support system. Participant is  able to verbalize types and ability to use techniques and skills needed for reducing stress and depression.   Initial Review & Psychosocial Screening: Initial Psych Review & Screening - 09/18/17 1515      Initial Review   Current issues with  Current Depression;Current Sleep Concerns;Current Stress Concerns    Source of Stress Concerns  Chronic Illness;Family;Unable to perform yard/household activities;Unable to participate in former interests or hobbies    Comments  Her son has been arrested for hugging people and has made a big impact on her and her family. She is also stressed because she cannot do some of the things she used to.       Family Dynamics   Good Support System?  Yes  Comments  Her son, daughter in law and daughter.      Barriers   Psychosocial barriers to participate in program  The patient should benefit from training in stress management and relaxation.      Screening Interventions   Interventions  Yes;Encouraged to exercise;Program counselor consult;Provide feedback about the scores to participant;To provide support and resources with identified psychosocial needs    Expected Outcomes  Long Term goal: The participant improves quality of Life and PHQ9 Scores as seen by post scores and/or verbalization of changes;Short Term goal: Identification and review with participant of any Quality of Life or Depression concerns found by scoring the questionnaire.;Long Term Goal: Stressors or current issues are controlled or eliminated.;Short Term goal: Utilizing psychosocial counselor, staff and physician to assist with identification of specific Stressors or current issues interfering with healing process. Setting desired goal for each stressor or current issue identified.       Quality of Life Scores:  Scores of 19 and below usually indicate a poorer quality of life in these areas.  A difference of  2-3 points is a clinically meaningful difference.  A difference of 2-3 points in  the total score of the Quality of Life Index has been associated with significant improvement in overall quality of life, self-image, physical symptoms, and general health in studies assessing change in quality of life.  PHQ-9: Recent Review Flowsheet Data    Depression screen [facility] 2/9 09/18/2017   Decreased Interest 3   Down, Depressed, Hopeless 2   PHQ - 2 Score 5   Altered sleeping 2   Tired, decreased energy 3   Change in appetite 0   Feeling bad or failure about yourself  1   Trouble concentrating 1   Moving slowly or fidgety/restless 1   Suicidal thoughts 0   PHQ-9 Score 13   Difficult doing work/chores Somewhat difficult     Interpretation of Total Score  Total Score Depression Severity:  1-4 = Minimal depression, 5-9 = Mild depression, 10-14 = Moderate depression, 15-19 = Moderately severe depression, 20-27 = Severe depression   Psychosocial Evaluation and Intervention:   Psychosocial Re-Evaluation:   Psychosocial Discharge (Final Psychosocial Re-Evaluation):   Education: Education Goals: Education classes will be provided on a weekly basis, covering required topics. Participant will state understanding/return demonstration of topics presented.  Learning Barriers/Preferences: Learning Barriers/Preferences - 09/18/17 1526      Learning Barriers/Preferences   Learning Barriers  Sight wears reading glasses    Learning Preferences  None       Education Topics:  Initial Evaluation Education: - Verbal, written and demonstration of respiratory meds, oximetry and breathing techniques. Instruction on use of nebulizers and MDIs and importance of monitoring MDI activations.   Pulmonary Rehab from 10/02/2017 in [facility] Cardiac and Pulmonary Rehab  Date  09/18/17  Educator  [facility]  Instruction Review Code  1- Verbalizes Understanding      General Nutrition Guidelines/Fats and Fiber: -Group instruction provided by verbal, written material, models and posters to present the  general guidelines for heart healthy nutrition. Gives an explanation and review of dietary fats and fiber.   Pulmonary Rehab from 10/02/2017 in [facility] Cardiac and Pulmonary Rehab  Date  10/02/17  Educator  CR  Instruction Review Code  1- Verbalizes Understanding      Controlling Sodium/Reading Food Labels: -Group verbal and written material supporting the discussion of sodium use in heart healthy nutrition. Review and explanation with models, verbal and written materials for utilization of the  food label.   Exercise Physiology & General Exercise Guidelines: - Group verbal and written instruction with models to review the exercise physiology of the cardiovascular system and associated critical values. Provides general exercise guidelines with specific guidelines to those with heart or lung disease.    Aerobic Exercise & Resistance Training: - Gives group verbal and written instruction on the various components of exercise. Focuses on aerobic and resistive training programs and the benefits of this training and how to safely progress through these programs.   Flexibility, Balance, Mind/Body Relaxation: Provides group verbal/written instruction on the benefits of flexibility and balance training, including mind/body exercise modes such as yoga, pilates and tai chi.  Demonstration and skill practice provided.   Stress and Anxiety: - Provides group verbal and written instruction about the health risks of elevated stress and causes of high stress.  Discuss the correlation between heart/lung disease and anxiety and treatment options. Review healthy ways to manage with stress and anxiety.   Depression: - Provides group verbal and written instruction on the correlation between heart/lung disease and depressed mood, treatment options, and the stigmas associated with seeking treatment.   Exercise & Equipment Safety: - Individual verbal instruction and demonstration of equipment use and safety with  use of the equipment.   Pulmonary Rehab from 10/02/2017 in [facility] Cardiac and Pulmonary Rehab  Date  09/18/17  Educator  [facility]  Instruction Review Code  1- Verbalizes Understanding      Infection Prevention: - Provides verbal and written material to individual with discussion of infection control including proper hand washing and proper equipment cleaning during exercise session.   Pulmonary Rehab from 10/02/2017 in [facility] Cardiac and Pulmonary Rehab  Date  09/18/17  Educator  [facility]  Instruction Review Code  1- Verbalizes Understanding      Falls Prevention: - Provides verbal and written material to individual with discussion of falls prevention and safety.   Pulmonary Rehab from 10/02/2017 in [facility] Cardiac and Pulmonary Rehab  Date  09/18/17  Educator  [facility]  Instruction Review Code  1- Verbalizes Understanding      Diabetes: - Individual verbal and written instruction to review signs/symptoms of diabetes, desired ranges of glucose level fasting, after meals and with exercise. Advice that pre and post exercise glucose checks will be done for 3 sessions at entry of program.   Chronic Lung Diseases: - Group verbal and written instruction to review updates, respiratory medications, advancements in procedures and treatments. Discuss use of supplemental oxygen including available portable oxygen systems, continuous and intermittent flow rates, concentrators, personal use and safety guidelines. Review proper use of inhaler and spacers. Provide informative websites for self-education.    Energy Conservation: - Provide group verbal and written instruction for methods to conserve energy, plan and organize activities. Instruct on pacing techniques, use of adaptive equipment and posture/positioning to relieve shortness of breath.   Triggers and Exacerbations: - Group verbal and written instruction to review types of environmental triggers and ways to prevent exacerbations. Discuss weather changes, air  quality and the benefits of nasal washing. Review warning signs and symptoms to help prevent infections. Discuss techniques for effective airway clearance, coughing, and vibrations.   AED/CPR: - Group verbal and written instruction with the use of models to demonstrate the basic use of the AED with the basic ABC's of resuscitation.   Anatomy and Physiology of the Lungs: - Group verbal and written instruction with the use of models to provide basic lung anatomy and physiology related to function,  structure and complications of lung disease.   Anatomy & Physiology of the Heart: - Group verbal and written instruction and models provide basic cardiac anatomy and physiology, with the coronary electrical and arterial systems. Review of Valvular disease and Heart Failure   Cardiac Medications: - Group verbal and written instruction to review commonly prescribed medications for heart disease. Reviews the medication, class of the drug, and side effects.   Know Your Numbers and Risk Factors: -Group verbal and written instruction about important numbers in your health.  Discussion of what are risk factors and how they play a role in the disease process.  Review of Cholesterol, Blood Pressure, Diabetes, and BMI and the role they play in your overall health.   Sleep Hygiene: -Provides group verbal and written instruction about how sleep can affect your health.  Define sleep hygiene, discuss sleep cycles and impact of sleep habits. Review good sleep hygiene tips.    Other: -Provides group and verbal instruction on various topics (see comments)   Pulmonary Rehab from 10/02/2017 in [facility] Cardiac and Pulmonary Rehab  Date  09/27/17  Educator  [facility]  Instruction Review Code  1- Verbalizes Understanding       Knowledge Questionnaire Score: Knowledge Questionnaire Score - 09/18/17 1523      Knowledge Questionnaire Score   Pre Score  14/18 reviewed with patient        Core Components/Risk  Factors/Patient Goals at Admission: Personal Goals and Risk Factors at Admission - 09/18/17 1528      Core Components/Risk Factors/Patient Goals on Admission    Weight Management  Yes;Weight Loss    Intervention  Weight Management: Develop a combined nutrition and exercise program designed to reach desired caloric intake, while maintaining appropriate intake of nutrient and fiber, sodium and fats, and appropriate energy expenditure required for the weight goal.;Weight Management: Provide education and appropriate resources to help participant work on and attain dietary goals.;Weight Management/Obesity: Establish reasonable short term and long term weight goals.    Admit Weight  151 lb 8 oz (68.7 kg)    Goal Weight: Short Term  145 lb (65.8 kg)    Goal Weight: Long Term  145 lb (65.8 kg)    Expected Outcomes  Short Term: Continue to assess and modify interventions until short term weight is achieved;Long Term: Adherence to nutrition and physical activity/exercise program aimed toward attainment of established weight goal;Weight Loss: Understanding of general recommendations for a balanced deficit meal plan, which promotes 1-2 lb weight loss per week and includes a negative energy balance of [phone] kcal/d;Understanding recommendations for meals to include 15-35% energy as protein, 25-35% energy from fat, 35-60% energy from carbohydrates, less than 212m of dietary cholesterol, 20-35 gm of total fiber daily;Understanding of distribution of calorie intake throughout the day with the consumption of 4-5 meals/snacks    Improve shortness of breath with ADL's  Yes    Intervention  Provide education, individualized exercise plan and daily activity instruction to help decrease symptoms of SOB with activities of daily living.    Expected Outcomes  Short Term: Achieves a reduction of symptoms when performing activities of daily living.    Hypertension  Yes takes medication    Intervention  Provide education on  lifestyle modifcations including regular physical activity/exercise, weight management, moderate sodium restriction and increased consumption of fresh fruit, vegetables, and low fat dairy, alcohol moderation, and smoking cessation.;Monitor prescription use compliance.    Expected Outcomes  Short Term: Continued assessment and intervention until BP is <  140/62m HG in hypertensive participants. < 130/828mHG in hypertensive participants with diabetes, heart failure or chronic kidney disease.;Long Term: Maintenance of blood pressure at goal levels.       Core Components/Risk Factors/Patient Goals Review:    Core Components/Risk Factors/Patient Goals at Discharge (Final Review):    ITP Comments: ITP Comments    Row Name 09/18/17 1456 10/16/17 0929 10/31/17 0855 11/06/17 0944 11/09/17 1129   ITP Comments  Medical Evaluation completed. Chart sent for review and changes to Dr. MaEmily Filbertirector of [place] can be found [facility]   [place] has been out of class due to a family emergency and states she will be back in 2 weeks. 30 day review completed. ITP sent to Dr. MaEmily Filbertirector of [place] with [place] unless changes are made by physician.  Patient has not attended class recently. Called to check up on patient. Informed her to call if she wants to continue [place] states that she will be back on Friday March 8th. [place]   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  Once removed, she will need to be out for 3 weeks.  This is scheduled for the week of 3/18.  With all of this going on, we talked about her best plan of action and decided to discharge at this time.  She plans to return in late April or early May.  Discharge ITP created and sent..      Comments: Discharge ITP

## 2017-11-09 NOTE — Telephone Encounter (Signed)
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  Once removed, she will need to be out for 3 weeks.  This is scheduled for the week of 3/18.  With all of this going on, we talked about her best plan of action and decided to discharge at this time.  She plans to return in late April or early May.

## 2017-11-09 NOTE — Progress Notes (Signed)
Discharge Progress Report  Patient Details  Name: Kim Owen MRN: [id number] Date of Birth: July 15, 1935 Referring Provider:     Pulmonary Rehab from 09/18/2017 in [facility] Cardiac and Pulmonary Rehab  Referring Provider  Otis Dials MD       Number of Visits: 6/36  Reason for Discharge:  Early Exit:  Personal and Lack of attendance  Smoking History:  Social History   Tobacco Use  Smoking Status Never Smoker  Smokeless Tobacco Never Used    Diagnosis:  Moderate persistent asthma without complication  ADL UCSD: Pulmonary Assessment Scores    Row Name 09/18/17 1521 10/31/17 1505       ADL UCSD   ADL Phase  Entry  Entry    SOB Score total  93  -    Rest  2  -    Walk  3  -    Stairs  5  -    Bath  4  -    Dress  4  -    Shop  4  -      CAT Score   CAT Score  26  -      mMRC Score   mMRC Score  -  3       Initial Exercise Prescription: Initial Exercise Prescription - 09/18/17 1600      Date of Initial Exercise RX and Referring Provider   Date  09/18/17    Referring Provider  Otis Dials MD      Treadmill   MPH  1.5    Grade  0.5    Minutes  15    METs  2.25      NuStep   Level  1    SPM  80    Minutes  15    METs  2      Arm Ergometer   Level  1    Watts  22    RPM  25    Minutes  15    METs  2      Prescription Details   Frequency (times per week)  3    Duration  Progress to 45 minutes of aerobic exercise without signs/symptoms of physical distress      Intensity   THRR 40-80% of Max Heartrate  106-129    Ratings of Perceived Exertion  11-13    Perceived Dyspnea  0-4      Progression   Progression  Continue to progress workloads to maintain intensity without signs/symptoms of physical distress.      Resistance Training   Training Prescription  Yes    Weight  3 lbs    Reps  10-15       Discharge Exercise Prescription (Final Exercise Prescription Changes): Exercise Prescription Changes - 10/06/17 1200      Home  Exercise Plan   Plans to continue exercise at  Home (comment) walking, gym in [place]    Frequency  Add 3 additional days to program exercise sessions.    Initial Home Exercises Provided  10/06/17       Functional Capacity: 6 Minute Walk    Row Name 09/18/17 1610         6 Minute Walk   Phase  Initial     Distance  930 feet     Walk Time  6 minutes     # of Rest Breaks  0     MPH  1.76     METS  1.98  RPE  11     Perceived Dyspnea   1     VO2 Peak  6.94     Symptoms  Yes (comment)     Comments  SOB, staggered gait     Resting HR  84 bpm     Resting BP  136/70     Resting Oxygen Saturation   98 %     Exercise Oxygen Saturation  during 6 min walk  98 %     Max Ex. HR  110 bpm     Max Ex. BP  134/68     2 Minute Post BP  132/64       Interval HR   1 Minute HR  103     2 Minute HR  107     3 Minute HR  110     4 Minute HR  107     5 Minute HR  106     6 Minute HR  106     2 Minute Post HR  93     Interval Heart Rate?  Yes       Interval Oxygen   Interval Oxygen?  Yes     Baseline Oxygen Saturation %  98 %     1 Minute Oxygen Saturation %  98 %     1 Minute Liters of Oxygen  0 L Room Air     2 Minute Oxygen Saturation %  98 %     2 Minute Liters of Oxygen  0 L     3 Minute Oxygen Saturation %  98 %     3 Minute Liters of Oxygen  0 L     4 Minute Oxygen Saturation %  98 %     4 Minute Liters of Oxygen  0 L     5 Minute Oxygen Saturation %  98 %     5 Minute Liters of Oxygen  0 L     6 Minute Oxygen Saturation %  99 %     6 Minute Liters of Oxygen  0 L     2 Minute Post Oxygen Saturation %  99 %     2 Minute Post Liters of Oxygen  0 L        Psychological, QOL, Others - Outcomes: PHQ 2/9: Depression screen PHQ 2/9 09/18/2017  Decreased Interest 3  Down, Depressed, Hopeless 2  PHQ - 2 Score 5  Altered sleeping 2  Tired, decreased energy 3  Change in appetite 0  Feeling bad or failure about yourself  1  Trouble concentrating 1  Moving slowly or  fidgety/restless 1  Suicidal thoughts 0  PHQ-9 Score 13  Difficult doing work/chores Somewhat difficult    Quality of Life:   Personal Goals: Goals established at orientation with interventions provided to work toward goal. Personal Goals and Risk Factors at Admission - 09/18/17 1528      Core Components/Risk Factors/Patient Goals on Admission    Weight Management  Yes;Weight Loss    Intervention  Weight Management: Develop a combined nutrition and exercise program designed to reach desired caloric intake, while maintaining appropriate intake of nutrient and fiber, sodium and fats, and appropriate energy expenditure required for the weight goal.;Weight Management: Provide education and appropriate resources to help participant work on and attain dietary goals.;Weight Management/Obesity: Establish reasonable short term and long term weight goals.    Admit Weight  151 lb 8 oz (68.7 kg)    Goal Weight: Short  Term  145 lb (65.8 kg)    Goal Weight: Long Term  145 lb (65.8 kg)    Expected Outcomes  Short Term: Continue to assess and modify interventions until short term weight is achieved;Long Term: Adherence to nutrition and physical activity/exercise program aimed toward attainment of established weight goal;Weight Loss: Understanding of general recommendations for a balanced deficit meal plan, which promotes 1-2 lb weight loss per week and includes a negative energy balance of [phone] kcal/d;Understanding recommendations for meals to include 15-35% energy as protein, 25-35% energy from fat, 35-60% energy from carbohydrates, less than 200mg  of dietary cholesterol, 20-35 gm of total fiber daily;Understanding of distribution of calorie intake throughout the day with the consumption of 4-5 meals/snacks    Improve shortness of breath with ADL's  Yes    Intervention  Provide education, individualized exercise plan and daily activity instruction to help decrease symptoms of SOB with activities of daily  living.    Expected Outcomes  Short Term: Achieves a reduction of symptoms when performing activities of daily living.    Hypertension  Yes takes medication    Intervention  Provide education on lifestyle modifcations including regular physical activity/exercise, weight management, moderate sodium restriction and increased consumption of fresh fruit, vegetables, and low fat dairy, alcohol moderation, and smoking cessation.;Monitor prescription use compliance.    Expected Outcomes  Short Term: Continued assessment and intervention until BP is < 140/15mm HG in hypertensive participants. < 130/56mm HG in hypertensive participants with diabetes, heart failure or chronic kidney disease.;Long Term: Maintenance of blood pressure at goal levels.        Personal Goals Discharge:   Exercise Goals and Review: Exercise Goals    Row Name 09/18/17 1619             Exercise Goals   Increase Physical Activity  Yes       Intervention  Provide advice, education, support and counseling about physical activity/exercise needs.;Develop an individualized exercise prescription for aerobic and resistive training based on initial evaluation findings, risk stratification, comorbidities and participant's personal goals.       Expected Outcomes  Achievement of increased cardiorespiratory fitness and enhanced flexibility, muscular endurance and strength shown through measurements of functional capacity and personal statement of participant.       Increase Strength and Stamina  Yes       Intervention  Provide advice, education, support and counseling about physical activity/exercise needs.;Develop an individualized exercise prescription for aerobic and resistive training based on initial evaluation findings, risk stratification, comorbidities and participant's personal goals.       Expected Outcomes  Achievement of increased cardiorespiratory fitness and enhanced flexibility, muscular endurance and strength shown through  measurements of functional capacity and personal statement of participant.       Able to understand and use rate of perceived exertion (RPE) scale  Yes       Intervention  Provide education and explanation on how to use RPE scale       Expected Outcomes  Short Term: Able to use RPE daily in rehab to express subjective intensity level;Long Term:  Able to use RPE to guide intensity level when exercising independently       Able to understand and use Dyspnea scale  Yes       Intervention  Provide education and explanation on how to use Dyspnea scale       Expected Outcomes  Short Term: Able to use Dyspnea scale daily in rehab to express subjective sense of  shortness of breath during exertion;Long Term: Able to use Dyspnea scale to guide intensity level when exercising independently       Knowledge and understanding of Target Heart Rate Range (THRR)  Yes       Intervention  Provide education and explanation of THRR including how the numbers were predicted and where they are located for reference       Expected Outcomes  Short Term: Able to state/look up THRR;Long Term: Able to use THRR to govern intensity when exercising independently;Short Term: Able to use daily as guideline for intensity in rehab       Able to check pulse independently  Yes       Intervention  Provide education and demonstration on how to check pulse in carotid and radial arteries.;Review the importance of being able to check your own pulse for safety during independent exercise       Expected Outcomes  Short Term: Able to explain why pulse checking is important during independent exercise;Long Term: Able to check pulse independently and accurately       Understanding of Exercise Prescription  Yes       Intervention  Provide education, explanation, and written materials on patient's individual exercise prescription       Expected Outcomes  Short Term: Able to explain program exercise prescription;Long Term: Able to explain home  exercise prescription to exercise independently          Nutrition & Weight - Outcomes: Pre Biometrics - 09/18/17 1620      Pre Biometrics   Height  5\' 6"  (1.676 m)    Weight  151 lb 8 oz (68.7 kg)    Waist Circumference  36 inches    Hip Circumference  40 inches    Waist to Hip Ratio  0.9 %    BMI (Calculated)  24.46        Nutrition: Nutrition Therapy & Goals - 09/25/17 1213      Nutrition Therapy   Diet  TLC    Protein (specify units)  6oz    Fiber  25 grams    Saturated Fats  12 max. grams    Fruits and Vegetables  6 servings/day    Sodium  2000 grams      Personal Nutrition Goals   Nutrition Goal  Consume more dietary sources of protein    Personal Goal #2  Aim to eat more balanced meals; IE a vegetable, carb and protein source together    Personal Goal #3  Continue to use salt sparingly      Intervention Plan   Intervention  Prescribe, educate and counsel regarding individualized specific dietary modifications aiming towards targeted core components such as weight, hypertension, lipid management, diabetes, heart failure and other comorbidities.    Expected Outcomes  Short Term Goal: Understand basic principles of dietary content, such as calories, fat, sodium, cholesterol and nutrients.;Short Term Goal: A plan has been developed with personal nutrition goals set during dietitian appointment.;Long Term Goal: Adherence to prescribed nutrition plan.       Nutrition Discharge: Nutrition Assessments - 09/18/17 1524      MEDFICTS Scores   Pre Score  34       Education Questionnaire Score: Knowledge Questionnaire Score - 09/18/17 1523      Knowledge Questionnaire Score   Pre Score  14/18 reviewed with patient       Goals reviewed with patient; copy given to patient.

## 2018-01-02 ENCOUNTER — Other Ambulatory Visit: Payer: Self-pay

## 2018-01-02 ENCOUNTER — Encounter: Payer: Medicare Other | Attending: Pulmonary Disease

## 2018-01-02 VITALS — Ht 66.0 in | Wt 153.6 lb

## 2018-01-02 DIAGNOSIS — I739 Peripheral vascular disease, unspecified: Secondary | ICD-10-CM | POA: Insufficient documentation

## 2018-01-02 DIAGNOSIS — J449 Chronic obstructive pulmonary disease, unspecified: Secondary | ICD-10-CM

## 2018-01-02 DIAGNOSIS — J4540 Moderate persistent asthma, uncomplicated: Secondary | ICD-10-CM | POA: Insufficient documentation

## 2018-01-02 DIAGNOSIS — I10 Essential (primary) hypertension: Secondary | ICD-10-CM | POA: Insufficient documentation

## 2018-01-02 DIAGNOSIS — K219 Gastro-esophageal reflux disease without esophagitis: Secondary | ICD-10-CM | POA: Insufficient documentation

## 2018-01-02 DIAGNOSIS — Z79899 Other long term (current) drug therapy: Secondary | ICD-10-CM | POA: Insufficient documentation

## 2018-01-02 NOTE — Patient Instructions (Signed)
Patient Instructions  Patient Details  Name: Kim Owen MRN: [id number] Date of Birth: August 18, 1935 Referring Provider:  Otis Dials, MD  Below are your personal goals for exercise, nutrition, and risk factors. Our goal is to help you stay on track towards obtaining and maintaining these goals. We will be discussing your progress on these goals with you throughout the program.  Initial Exercise Prescription: Initial Exercise Prescription - 01/02/18 1100      Date of Initial Exercise RX and Referring Provider   Date  01/02/18    Referring Provider  Eliberto Ivory      Treadmill   MPH  1.3    Grade  0.5    Minutes  15    METs  2.09      NuStep   Level  2    SPM  80    Minutes  15    METs  2      Arm Ergometer   Level  1    Watts  22    RPM  25    Minutes  15    METs  2      Biostep-RELP   Level  2    SPM  45    Minutes  15    METs  2      Prescription Details   Frequency (times per week)  3    Duration  Progress to 45 minutes of aerobic exercise without signs/symptoms of physical distress      Intensity   THRR 40-80% of Max Heartrate  101-126    Ratings of Perceived Exertion  11-13    Perceived Dyspnea  0-4      Resistance Training   Training Prescription  Yes    Weight  3 lb    Reps  10-15       Exercise Goals: Frequency: Be able to perform aerobic exercise two to three times per week in program working toward 2-5 days per week of home exercise.  Intensity: Work with a perceived exertion of 11 (fairly light) - 15 (hard) while following your exercise prescription.  We will make changes to your prescription with you as you progress through the program.   Duration: Be able to do 30 to 45 minutes of continuous aerobic exercise in addition to a 5 minute warm-up and a 5 minute cool-down routine.   Nutrition Goals: Your personal nutrition goals will be established when you do your nutrition analysis with the dietician.  The following are general nutrition  guidelines to follow: Cholesterol < /day Sodium < /day Fiber: Women over 50 yrs - 21 grams per day  Personal Goals: Personal Goals and Risk Factors at Admission - 01/02/18 1123      Core Components/Risk Factors/Patient Goals on Admission    Weight Management  Yes;Weight Loss    Intervention  Weight Management: Develop a combined nutrition and exercise program designed to reach desired caloric intake, while maintaining appropriate intake of nutrient and fiber, sodium and fats, and appropriate energy expenditure required for the weight goal.;Weight Management: Provide education and appropriate resources to help participant work on and attain dietary goals.;Weight Management/Obesity: Establish reasonable short term and long term weight goals.    Admit Weight  153 lb 9.6 oz (69.7 kg)    Goal Weight: Short Term  148 lb (67.1 kg)    Goal Weight: Long Term  148 lb (67.1 kg)    Expected Outcomes  Short Term: Continue to assess and modify interventions until short  term weight is achieved;Long Term: Adherence to nutrition and physical activity/exercise program aimed toward attainment of established weight goal;Weight Loss: Understanding of general recommendations for a balanced deficit meal plan, which promotes 1-2 lb weight loss per week and includes a negative energy balance of [phone] kcal/d;Understanding recommendations for meals to include 15-35% energy as protein, 25-35% energy from fat, 35-60% energy from carbohydrates, less than  of dietary cholesterol, 20-35 gm of total fiber daily;Understanding of distribution of calorie intake throughout the day with the consumption of 4-5 meals/snacks;Weight Maintenance: Understanding of the daily nutrition guidelines, which includes 25-35% calories from fat, 7% or less cal from saturated fats, less than  cholesterol, less than 1.5gm of sodium, & 5 or more servings of fruits and vegetables daily    Improve shortness of breath with ADL's  Yes     Intervention  Provide education, individualized exercise plan and daily activity instruction to help decrease symptoms of SOB with activities of daily living.    Expected Outcomes  Short Term: Improve cardiorespiratory fitness to achieve a reduction of symptoms when performing ADLs;Long Term: Be able to perform more ADLs without symptoms or delay the onset of symptoms    Hypertension  Yes    Intervention  Provide education on lifestyle modifcations including regular physical activity/exercise, weight management, moderate sodium restriction and increased consumption of fresh fruit, vegetables, and low fat dairy, alcohol moderation, and smoking cessation.;Monitor prescription use compliance.    Expected Outcomes  Short Term: Continued assessment and intervention until BP is < 140/85mm HG in hypertensive participants. < 130/66mm HG in hypertensive participants with diabetes, heart failure or chronic kidney disease.;Long Term: Maintenance of blood pressure at goal levels.    Lipids  Yes    Intervention  Provide education and support for participant on nutrition & aerobic/resistive exercise along with prescribed medications to achieve LDL 70mg , HDL >40mg .    Expected Outcomes  Short Term: Participant states understanding of desired cholesterol values and is compliant with medications prescribed. Participant is following exercise prescription and nutrition guidelines.;Long Term: Cholesterol controlled with medications as prescribed, with individualized exercise RX and with personalized nutrition plan. Value goals: LDL < , HDL > 40 mg.       Tobacco Use Initial Evaluation: Social History   Tobacco Use  Smoking Status Never Smoker  Smokeless Tobacco Never Used    Exercise Goals and Review: Exercise Goals    Row Name 09/18/17 1619 01/02/18 1139           Exercise Goals   Increase Physical Activity  Yes  Yes      Intervention  Provide advice, education, support and counseling about physical  activity/exercise needs.;Develop an individualized exercise prescription for aerobic and resistive training based on initial evaluation findings, risk stratification, comorbidities and participant's personal goals.  Provide advice, education, support and counseling about physical activity/exercise needs.;Develop an individualized exercise prescription for aerobic and resistive training based on initial evaluation findings, risk stratification, comorbidities and participant's personal goals.      Expected Outcomes  Achievement of increased cardiorespiratory fitness and enhanced flexibility, muscular endurance and strength shown through measurements of functional capacity and personal statement of participant.  Short Term: Attend rehab on a regular basis to increase amount of physical activity.;Long Term: Add in home exercise to make exercise part of routine and to increase amount of physical activity.;Long Term: Exercising regularly at least 3-5 days a week.      Increase Strength and Stamina  Yes  Yes  Intervention  Provide advice, education, support and counseling about physical activity/exercise needs.;Develop an individualized exercise prescription for aerobic and resistive training based on initial evaluation findings, risk stratification, comorbidities and participant's personal goals.  Provide advice, education, support and counseling about physical activity/exercise needs.;Develop an individualized exercise prescription for aerobic and resistive training based on initial evaluation findings, risk stratification, comorbidities and participant's personal goals.      Expected Outcomes  Achievement of increased cardiorespiratory fitness and enhanced flexibility, muscular endurance and strength shown through measurements of functional capacity and personal statement of participant.  Short Term: Increase workloads from initial exercise prescription for resistance, speed, and METs.;Short Term: Perform  resistance training exercises routinely during rehab and add in resistance training at home;Long Term: Improve cardiorespiratory fitness, muscular endurance and strength as measured by increased METs and functional capacity ( )      Able to understand and use rate of perceived exertion (RPE) scale  Yes  Yes      Intervention  Provide education and explanation on how to use RPE scale  Provide education and explanation on how to use RPE scale      Expected Outcomes  Short Term: Able to use RPE daily in rehab to express subjective intensity level;Long Term:  Able to use RPE to guide intensity level when exercising independently  Short Term: Able to use RPE daily in rehab to express subjective intensity level;Long Term:  Able to use RPE to guide intensity level when exercising independently      Able to understand and use Dyspnea scale  Yes  Yes      Intervention  Provide education and explanation on how to use Dyspnea scale  Provide education and explanation on how to use Dyspnea scale      Expected Outcomes  Short Term: Able to use Dyspnea scale daily in rehab to express subjective sense of shortness of breath during exertion;Long Term: Able to use Dyspnea scale to guide intensity level when exercising independently  Short Term: Able to use Dyspnea scale daily in rehab to express subjective sense of shortness of breath during exertion;Long Term: Able to use Dyspnea scale to guide intensity level when exercising independently      Knowledge and understanding of Target Heart Rate Range (THRR)  Yes  Yes      Intervention  Provide education and explanation of THRR including how the numbers were predicted and where they are located for reference  Provide education and explanation of THRR including how the numbers were predicted and where they are located for reference      Expected Outcomes  Short Term: Able to state/look up THRR;Long Term: Able to use THRR to govern intensity when exercising  independently;Short Term: Able to use daily as guideline for intensity in rehab  Short Term: Able to state/look up THRR;Short Term: Able to use daily as guideline for intensity in rehab;Long Term: Able to use THRR to govern intensity when exercising independently      Able to check pulse independently  Yes  Yes      Intervention  Provide education and demonstration on how to check pulse in carotid and radial arteries.;Review the importance of being able to check your own pulse for safety during independent exercise  Provide education and demonstration on how to check pulse in carotid and radial arteries.;Review the importance of being able to check your own pulse for safety during independent exercise      Expected Outcomes  Short Term: Able to explain why pulse  checking is important during independent exercise;Long Term: Able to check pulse independently and accurately  Short Term: Able to explain why pulse checking is important during independent exercise;Long Term: Able to check pulse independently and accurately      Understanding of Exercise Prescription  Yes  Yes      Intervention  Provide education, explanation, and written materials on patient's individual exercise prescription  Provide education, explanation, and written materials on patient's individual exercise prescription      Expected Outcomes  Short Term: Able to explain program exercise prescription;Long Term: Able to explain home exercise prescription to exercise independently  Short Term: Able to explain program exercise prescription;Long Term: Able to explain home exercise prescription to exercise independently         Copy of goals given to participant.

## 2018-01-02 NOTE — Progress Notes (Signed)
Pulmonary Individual Treatment Plan  Patient Details  Name: Kim Owen MRN: [id number] Date of Birth: 1935-06-19 Referring Provider:     Pulmonary Rehab from 01/02/2018 in [facility] Cardiac and Pulmonary Rehab  Referring Provider  Liane Comber      Initial Encounter Date:    Pulmonary Rehab from 01/02/2018 in [facility] Cardiac and Pulmonary Rehab  Date  01/02/18  Referring Provider  Liane Comber      Visit Diagnosis: Chronic obstructive pulmonary disease, unspecified COPD type ([place])  Patient's Home Medications on Admission:  Current Outpatient Medications:  .  albuterol (PROVENTIL HFA;VENTOLIN HFA) 108 (90 Base) MCG/ACT inhaler, Inhale 2 puffs into the lungs every 6 (six) hours as needed for wheezing or shortness of breath., Disp: , Rfl:  .  amLODipine (NORVASC) 5 MG tablet, Take 5 mg by mouth daily., Disp: , Rfl:  .  azelastine (ASTELIN) 0.1 % nasal spray, Place 2 sprays into both nostrils 2 (two) times daily. Use in each nostril as directed, Disp: , Rfl:  .  diclofenac sodium (VOLTAREN) 1 % GEL, Apply 2 g topically 4 (four) times daily., Disp: , Rfl:  .  esomeprazole (NEXIUM) 40 MG capsule, Take 40 mg by mouth daily at 12 noon., Disp: , Rfl:  .  Fluticasone-Salmeterol (ADVAIR) 250-50 MCG/DOSE AEPB, Inhale 1 puff into the lungs 2 (two) times daily., Disp: , Rfl:  .  HYDROcodone-acetaminophen (NORCO/VICODIN) 5-325 MG tablet, 1-2 tabs po qd prn severe pain, Disp: 6 tablet, Rfl: 0 .  levothyroxine (SYNTHROID, LEVOTHROID) 25 MCG tablet, Take 25 mcg by mouth daily before breakfast., Disp: , Rfl:  .  MOMETASONE FUROATE NA, Place into the nose., Disp: , Rfl:  .  montelukast (SINGULAIR) 10 MG tablet, Take 10 mg by mouth at bedtime., Disp: , Rfl:  .  predniSONE (DELTASONE) 20 MG tablet, 2 tabs po qd for 2 days, then 1 tab po qd for 2 days, then half at tab po qd for 2 days, Disp: 7 tablet, Rfl: 0 .  SUMAtriptan (IMITREX) 50 MG tablet, Take 50 mg by mouth every 2 (two) hours as needed for migraine. May  repeat in 2 hours if headache persists or recurs., Disp: , Rfl:  .  tiotropium (SPIRIVA) 18 MCG inhalation capsule, Place 18 mcg into inhaler and inhale daily., Disp: , Rfl:  .  topiramate (TOPAMAX) 50 MG tablet, Take 50 mg by mouth 2 (two) times daily., Disp: , Rfl:   Past Medical History: Past Medical History:  Diagnosis Date  . Asthma   . Barrett esophagus   . Cataract   . Colitis   . Fatty liver   . GERD (gastroesophageal reflux disease)   . Herpes zoster   . Hypertension   . PVD (peripheral vascular disease) ([place])     Tobacco Use: Social History   Tobacco Use  Smoking Status Never Smoker  Smokeless Tobacco Never Used    Labs: Recent Review Flowsheet Data    There is no flowsheet data to display.       Pulmonary Assessment Scores: Pulmonary Assessment Scores    Row Name 09/18/17 1521 01/02/18 1116       ADL UCSD   ADL Phase  Entry  Entry    SOB Score total  93  63    Rest  2  0    Walk  3  3    Stairs  5  4    Bath  4  2    Dress  4  2  Shop  4  2      CAT Score   CAT Score  26  26      mMRC Score   mMRC Score  -  3       Pulmonary Function Assessment: Pulmonary Function Assessment - 01/02/18 1108      Pulmonary Function Tests   FVC%  116.9 % PFT done on 01/13/17    FEV1%  109.8 %    FEV1/FVC Ratio  70.01      Breath   Bilateral Breath Sounds  Clear    Shortness of Breath  Limiting activity;Yes       Exercise Target Goals: Date: 01/02/18  Exercise Program Goal: Individual exercise prescription set using results from initial 6 min walk test and THRR while considering  patient's activity barriers and safety.    Exercise Prescription Goal: Initial exercise prescription builds to 30-45 minutes a day of aerobic activity, 2-3 days per week.  Home exercise guidelines will be given to patient during program as part of exercise prescription that the participant will acknowledge.  Activity Barriers & Risk Stratification: Activity Barriers &  Cardiac Risk Stratification - 09/18/17 1613      Activity Barriers & Cardiac Risk Stratification   Activity Barriers  Muscular Weakness;Deconditioning;Shortness of Breath;Joint Problems;Arthritis;Back Problems;Balance Concerns right rotator cuff tear, occasional back pain       6 Minute Walk: 6 Minute Walk    Row Name 09/18/17 1610 01/02/18 1139       6 Minute Walk   Phase  Initial  -    Distance  930 feet  960 feet    Walk Time  6 minutes  6 minutes    # of Rest Breaks  0  0    MPH  1.76  1.82    METS  1.98  2.04    RPE  11  16    Perceived Dyspnea   1  4    VO2 Peak  6.94  7.15    Symptoms  Yes (comment)  No    Comments  SOB, staggered gait  -    Resting HR  84 bpm  75 bpm    Resting BP  136/70  126/52    Resting Oxygen Saturation   98 %  100 %    Exercise Oxygen Saturation  during 6 min walk  98 %  98 %    Max Ex. HR  110 bpm  99 bpm    Max Ex. BP  134/68  160/60    2 Minute Post BP  132/64  132/60      Interval HR   1 Minute HR  103  90    2 Minute HR  107  94    3 Minute HR  110  -    4 Minute HR  107  91    5 Minute HR  106  96    6 Minute HR  106  99    2 Minute Post HR  93  84    Interval Heart Rate?  Yes  Yes      Interval Oxygen   Interval Oxygen?  Yes  Yes    Baseline Oxygen Saturation %  98 %  100 %    1 Minute Oxygen Saturation %  98 %  100 %    1 Minute Liters of Oxygen  0 L Room Air  0 L    2 Minute Oxygen Saturation %  98 %  99 %    2 Minute Liters of Oxygen  0 L  0 L    3 Minute Oxygen Saturation %  98 %  -    3 Minute Liters of Oxygen  0 L  0 L    4 Minute Oxygen Saturation %  98 %  100 %    4 Minute Liters of Oxygen  0 L  0 L    5 Minute Oxygen Saturation %  98 %  100 %    5 Minute Liters of Oxygen  0 L  0 L    6 Minute Oxygen Saturation %  99 %  99 %    6 Minute Liters of Oxygen  0 L  0 L    2 Minute Post Oxygen Saturation %  99 %  98 %    2 Minute Post Liters of Oxygen  0 L  0 L      Oxygen Initial Assessment: Oxygen Initial  Assessment - 01/02/18 1122      Home Oxygen   Home Oxygen Device  None    Sleep Oxygen Prescription  None    Home Exercise Oxygen Prescription  None    Home at Rest Exercise Oxygen Prescription  None      Initial 6 min Walk   Oxygen Used  None      Program Oxygen Prescription   Program Oxygen Prescription  None      Intervention   Short Term Goals  To learn and demonstrate proper use of respiratory medications;To learn and demonstrate proper pursed lip breathing techniques or other breathing techniques.;To learn and understand importance of maintaining oxygen saturations>88%;To learn and understand importance of monitoring SPO2 with pulse oximeter and demonstrate accurate use of the pulse oximeter.    Long  Term Goals  Demonstrates proper use of MDI's;Compliance with respiratory medication;Exhibits proper breathing techniques, such as pursed lip breathing or other method taught during program session;Maintenance of O2 saturations>88%;Verbalizes importance of monitoring SPO2 with pulse oximeter and return demonstration       Oxygen Re-Evaluation: Oxygen Re-Evaluation    Row Name 09/25/17 1138             Goals/Expected Outcomes   Short Term Goals  To learn and demonstrate proper use of respiratory medications;To learn and demonstrate proper pursed lip breathing techniques or other breathing techniques.;To learn and understand importance of maintaining oxygen saturations>88%;To learn and understand importance of monitoring SPO2 with pulse oximeter and demonstrate accurate use of the pulse oximeter.       Long  Term Goals  Demonstrates proper use of MDI's;Compliance with respiratory medication;Exhibits proper breathing techniques, such as pursed lip breathing or other method taught during program session;Maintenance of O2 saturations>88%;Verbalizes importance of monitoring SPO2 with pulse oximeter and return demonstration       Comments  Reviewed PLB technique with pt.  Talked about how  it work and it's important to maintaining his exercise saturations.         Goals/Expected Outcomes  Short: Become more profiecient at using PLB.   Long: Become independent at using PLB.          Oxygen Discharge (Final Oxygen Re-Evaluation): Oxygen Re-Evaluation - 09/25/17 1138      Goals/Expected Outcomes   Short Term Goals  To learn and demonstrate proper use of respiratory medications;To learn and demonstrate proper pursed lip breathing techniques or other breathing techniques.;To learn and understand importance of maintaining oxygen saturations>88%;To learn and understand importance of monitoring SPO2  with pulse oximeter and demonstrate accurate use of the pulse oximeter.    Long  Term Goals  Demonstrates proper use of MDI's;Compliance with respiratory medication;Exhibits proper breathing techniques, such as pursed lip breathing or other method taught during program session;Maintenance of O2 saturations>88%;Verbalizes importance of monitoring SPO2 with pulse oximeter and return demonstration    Comments  Reviewed PLB technique with pt.  Talked about how it work and it's important to maintaining his exercise saturations.      Goals/Expected Outcomes  Short: Become more profiecient at using PLB.   Long: Become independent at using PLB.       Initial Exercise Prescription: Initial Exercise Prescription - 01/02/18 1100      Date of Initial Exercise RX and Referring Provider   Date  01/02/18    Referring Provider  Liane Comber      Treadmill   MPH  1.3    Grade  0.5    Minutes  15    METs  2.09      NuStep   Level  2    SPM  80    Minutes  15    METs  2      Arm Ergometer   Level  1    Watts  22    RPM  25    Minutes  15    METs  2      Biostep-RELP   Level  2    SPM  45    Minutes  15    METs  2      Prescription Details   Frequency (times per week)  3    Duration  Progress to 45 minutes of aerobic exercise without signs/symptoms of physical distress      Intensity    THRR 40-80% of Max Heartrate  101-126    Ratings of Perceived Exertion  11-13    Perceived Dyspnea  0-4      Resistance Training   Training Prescription  Yes    Weight  3 lb    Reps  10-15       Perform Capillary Blood Glucose checks as needed.  Exercise Prescription Changes: Exercise Prescription Changes    Row Name 09/18/17 1600 10/04/17 1500 10/06/17 1200         Response to Exercise   Blood Pressure (Admit)  136/70  130/80  -     Blood Pressure (Exercise)  134/68  136/60  -     Blood Pressure (Exit)  132/64  122/62  -     Heart Rate (Admit)  84 bpm  92 bpm  -     Heart Rate (Exercise)  110 bpm  98 bpm  -     Heart Rate (Exit)  93 bpm  69 bpm  -     Oxygen Saturation (Admit)  98 %  99 %  -     Oxygen Saturation (Exercise)  98 %  98 %  -     Oxygen Saturation (Exit)  99 %  99 %  -     Rating of Perceived Exertion (Exercise)  11  13  -     Perceived Dyspnea (Exercise)  1  1  -     Symptoms  SOB, staggered gait  none  -     Comments  walk test results  -  -     Duration  -  Continue with 45 min of aerobic exercise without signs/symptoms of physical distress.  -  Intensity  -  THRR unchanged  -       Progression   Progression  -  Continue to progress workloads to maintain intensity without signs/symptoms of physical distress.  -     Average METs  -  2.07  -       Resistance Training   Training Prescription  -  Yes  -     Weight  -  3 lbs  -     Reps  -  10-15  -       Interval Training   Interval Training  -  No  -       Treadmill   MPH  -  1.5  -     Grade  -  0.5  -     Minutes  -  15  -     METs  -  2.25  -       NuStep   Level  -  2  -     SPM  -  86  -     Minutes  -  15  -     METs  -  2  -       Biostep-RELP   Level  -  2  -     SPM  -  45  -     Minutes  -  15  -     METs  -  2  -       Home Exercise Plan   Plans to continue exercise at  -  -  Home (comment) walking, gym in [place]     Frequency  -  -  Add 3 additional days to program exercise  sessions.     Initial Home Exercises Provided  -  -  10/06/17        Exercise Comments: Exercise Comments    Row Name 09/25/17 1138           Exercise Comments  First full day of exercise!  Patient was oriented to gym and equipment including functions, settings, policies, and procedures.  Patient's individual exercise prescription and treatment plan were reviewed.  All starting workloads were established based on the results of the 6 minute walk test done at initial orientation visit.  The plan for exercise progression was also introduced and progression will be customized based on patient's performance and goals.          Exercise Goals and Review: Exercise Goals    Row Name 09/18/17 1619 01/02/18 1139           Exercise Goals   Increase Physical Activity  Yes  Yes      Intervention  Provide advice, education, support and counseling about physical activity/exercise needs.;Develop an individualized exercise prescription for aerobic and resistive training based on initial evaluation findings, risk stratification, comorbidities and participant's personal goals.  Provide advice, education, support and counseling about physical activity/exercise needs.;Develop an individualized exercise prescription for aerobic and resistive training based on initial evaluation findings, risk stratification, comorbidities and participant's personal goals.      Expected Outcomes  Achievement of increased cardiorespiratory fitness and enhanced flexibility, muscular endurance and strength shown through measurements of functional capacity and personal statement of participant.  Short Term: Attend rehab on a regular basis to increase amount of physical activity.;Long Term: Add in home exercise to make exercise part of routine and to increase amount of physical activity.;Long Term: Exercising regularly at least 3-5 days  a week.      Increase Strength and Stamina  Yes  Yes      Intervention  Provide advice,  education, support and counseling about physical activity/exercise needs.;Develop an individualized exercise prescription for aerobic and resistive training based on initial evaluation findings, risk stratification, comorbidities and participant's personal goals.  Provide advice, education, support and counseling about physical activity/exercise needs.;Develop an individualized exercise prescription for aerobic and resistive training based on initial evaluation findings, risk stratification, comorbidities and participant's personal goals.      Expected Outcomes  Achievement of increased cardiorespiratory fitness and enhanced flexibility, muscular endurance and strength shown through measurements of functional capacity and personal statement of participant.  Short Term: Increase workloads from initial exercise prescription for resistance, speed, and METs.;Short Term: Perform resistance training exercises routinely during rehab and add in resistance training at home;Long Term: Improve cardiorespiratory fitness, muscular endurance and strength as measured by increased METs and functional capacity (6MWT)      Able to understand and use rate of perceived exertion (RPE) scale  Yes  Yes      Intervention  Provide education and explanation on how to use RPE scale  Provide education and explanation on how to use RPE scale      Expected Outcomes  Short Term: Able to use RPE daily in rehab to express subjective intensity level;Long Term:  Able to use RPE to guide intensity level when exercising independently  Short Term: Able to use RPE daily in rehab to express subjective intensity level;Long Term:  Able to use RPE to guide intensity level when exercising independently      Able to understand and use Dyspnea scale  Yes  Yes      Intervention  Provide education and explanation on how to use Dyspnea scale  Provide education and explanation on how to use Dyspnea scale      Expected Outcomes  Short Term: Able to use  Dyspnea scale daily in rehab to express subjective sense of shortness of breath during exertion;Long Term: Able to use Dyspnea scale to guide intensity level when exercising independently  Short Term: Able to use Dyspnea scale daily in rehab to express subjective sense of shortness of breath during exertion;Long Term: Able to use Dyspnea scale to guide intensity level when exercising independently      Knowledge and understanding of Target Heart Rate Range (THRR)  Yes  Yes      Intervention  Provide education and explanation of THRR including how the numbers were predicted and where they are located for reference  Provide education and explanation of THRR including how the numbers were predicted and where they are located for reference      Expected Outcomes  Short Term: Able to state/look up THRR;Long Term: Able to use THRR to govern intensity when exercising independently;Short Term: Able to use daily as guideline for intensity in rehab  Short Term: Able to state/look up THRR;Short Term: Able to use daily as guideline for intensity in rehab;Long Term: Able to use THRR to govern intensity when exercising independently      Able to check pulse independently  Yes  Yes      Intervention  Provide education and demonstration on how to check pulse in carotid and radial arteries.;Review the importance of being able to check your own pulse for safety during independent exercise  Provide education and demonstration on how to check pulse in carotid and radial arteries.;Review the importance of being able to check your own pulse  for safety during independent exercise      Expected Outcomes  Short Term: Able to explain why pulse checking is important during independent exercise;Long Term: Able to check pulse independently and accurately  Short Term: Able to explain why pulse checking is important during independent exercise;Long Term: Able to check pulse independently and accurately      Understanding of Exercise  Prescription  Yes  Yes      Intervention  Provide education, explanation, and written materials on patient's individual exercise prescription  Provide education, explanation, and written materials on patient's individual exercise prescription      Expected Outcomes  Short Term: Able to explain program exercise prescription;Long Term: Able to explain home exercise prescription to exercise independently  Short Term: Able to explain program exercise prescription;Long Term: Able to explain home exercise prescription to exercise independently         Exercise Goals Re-Evaluation : Exercise Goals Re-Evaluation    Row Name 09/25/17 1138 10/04/17 1515 10/06/17 1228         Exercise Goal Re-Evaluation   Exercise Goals Review  Understanding of Exercise Prescription;Able to understand and use Dyspnea scale;Knowledge and understanding of Target Heart Rate Range (THRR);Able to understand and use rate of perceived exertion (RPE) scale  Increase Physical Activity;Understanding of Exercise Prescription;Increase Strength and Stamina  Understanding of Exercise Prescription;Increase Physical Activity;Increase Strength and Stamina;Able to understand and use Dyspnea scale;Knowledge and understanding of Target Heart Rate Range (THRR);Able to understand and use rate of perceived exertion (RPE) scale;Able to check pulse independently     Comments  Reviewed RPE scale, THR and program prescription with pt today.  Pt voiced understanding and was given a copy of goals to take home.   Soriyah is off to a good start in rehab.  She is already up to level 2 on the BioStep and NuStep.  We will continue to monitor her progression.   Reviewed home exercise with pt today.  Pt plans to walk at home for exercise. She is going to be in [place] for the next two week and will be going to the gym up there. Reviewed THR, pulse, RPE, sign and symptoms, NTG use, and when to call 911 or MD.  Also discussed weather considerations and indoor  options.  Pt voiced understanding.     Expected Outcomes  Short: Use RPE daily to regulate intensity.  Long: Follow program prescription in THR.  Short: Continue to attend rehab regularly.  Long: Continue to increase physcial activity.   Short: Go to gym while in [place].  Long: Continue to exercise more at home as she is only attending two days a week.         Discharge Exercise Prescription (Final Exercise Prescription Changes): Exercise Prescription Changes - 10/06/17 1200      Home Exercise Plan   Plans to continue exercise at  Home (comment) walking, gym in [place]    Frequency  Add 3 additional days to program exercise sessions.    Initial Home Exercises Provided  10/06/17       Nutrition:  Target Goals: Understanding of nutrition guidelines, daily intake of sodium <1559m, cholesterol <2079m calories 30% from fat and 7% or less from saturated fats, daily to have 5 or more servings of fruits and vegetables.  Biometrics: Pre Biometrics - 01/02/18 1138      Pre Biometrics   Height  '5\' 6"'  (1.676 m)    Weight  153 lb 9.6 oz (69.7 kg)    Waist Circumference  36.25 inches    Hip Circumference  39 inches    Waist to Hip Ratio  0.93 %    BMI (Calculated)  24.8        Nutrition Therapy Plan and Nutrition Goals: Nutrition Therapy & Goals - 01/02/18 1115      Personal Nutrition Goals   Nutrition Goal  Learn to eat better and lose some weight.    Comments  She eats lot of vegetables and wants to learn to eat better.      Intervention Plan   Intervention  Prescribe, educate and counsel regarding individualized specific dietary modifications aiming towards targeted core components such as weight, hypertension, lipid management, diabetes, heart failure and other comorbidities.    Expected Outcomes  Short Term Goal: Understand basic principles of dietary content, such as calories, fat, sodium, cholesterol and nutrients.;Long Term Goal: Adherence to prescribed nutrition plan.        Nutrition Assessments: Nutrition Assessments - 01/02/18 1115      MEDFICTS Scores   Pre Score  27       Nutrition Goals Re-Evaluation:   Nutrition Goals Discharge (Final Nutrition Goals Re-Evaluation):   Psychosocial: Target Goals: Acknowledge presence or absence of significant depression and/or stress, maximize coping skills, provide positive support system. Participant is able to verbalize types and ability to use techniques and skills needed for reducing stress and depression.   Initial Review & Psychosocial Screening: Initial Psych Review & Screening - 01/02/18 1115      Initial Review   Current issues with  Current Depression;Current Sleep Concerns;Current Stress Concerns    Source of Stress Concerns  Chronic Illness;Family;Unable to perform yard/household activities;Unable to participate in former interests or hobbies    Comments  Her family is still having some issues but is working through it      [place]?  Yes    Comments  Her son, daughter in law and daughter.      Barriers   Psychosocial barriers to participate in program  The patient should benefit from training in stress management and relaxation.      Screening Interventions   Interventions  To provide support and resources with identified psychosocial needs;Program counselor consult;Encouraged to exercise;Provide feedback about the scores to participant    Expected Outcomes  Long Term goal: The participant improves quality of Life and PHQ9 Scores as seen by post scores and/or verbalization of changes;Short Term goal: Identification and review with participant of any Quality of Life or Depression concerns found by scoring the questionnaire.;Long Term Goal: Stressors or current issues are controlled or eliminated.;Short Term goal: Utilizing psychosocial counselor, staff and physician to assist with identification of specific Stressors or current issues interfering with healing  process. Setting desired goal for each stressor or current issue identified.       Quality of Life Scores:  Scores of 19 and below usually indicate a poorer quality of life in these areas.  A difference of  2-3 points is a clinically meaningful difference.  A difference of 2-3 points in the total score of the Quality of Life Index has been associated with significant improvement in overall quality of life, self-image, physical symptoms, and general health in studies assessing change in quality of life.  PHQ-9: Recent Review Flowsheet Data    Depression screen [facility] 2/9 01/02/2018 09/18/2017   Decreased Interest 2 3   Down, Depressed, Hopeless 1 2   PHQ - 2 Score 3 5   Altered sleeping  0 2   Tired, decreased energy 1 3   Change in appetite 0 0   Feeling bad or failure about yourself  1 1   Trouble concentrating 1 1   Moving slowly or fidgety/restless 0 1   Suicidal thoughts 0 0   PHQ-9 Score 6 13   Difficult doing work/chores Somewhat difficult Somewhat difficult     Interpretation of Total Score  Total Score Depression Severity:  1-4 = Minimal depression, 5-9 = Mild depression, 10-14 = Moderate depression, 15-19 = Moderately severe depression, 20-27 = Severe depression   Psychosocial Evaluation and Intervention:   Psychosocial Re-Evaluation:   Psychosocial Discharge (Final Psychosocial Re-Evaluation):   Education: Education Goals: Education classes will be provided on a weekly basis, covering required topics. Participant will state understanding/return demonstration of topics presented.  Learning Barriers/Preferences: Learning Barriers/Preferences - 01/02/18 1107      Learning Barriers/Preferences   Learning Barriers  Sight Wears reading glasses    Learning Preferences  None       Education Topics:  Initial Evaluation Education: - Verbal, written and demonstration of respiratory meds, oximetry and breathing techniques. Instruction on use of nebulizers and MDIs and  importance of monitoring MDI activations.   Pulmonary Rehab from 01/02/2018 in [facility] Cardiac and Pulmonary Rehab  Date  01/02/18  Educator  [facility]  Instruction Review Code  1- Verbalizes Understanding      General Nutrition Guidelines/Fats and Fiber: -Group instruction provided by verbal, written material, models and posters to present the general guidelines for heart healthy nutrition. Gives an explanation and review of dietary fats and fiber.   Pulmonary Rehab from 10/02/2017 in [facility] Cardiac and Pulmonary Rehab  Date  10/02/17  Educator  CR  Instruction Review Code  1- Verbalizes Understanding      Controlling Sodium/Reading Food Labels: -Group verbal and written material supporting the discussion of sodium use in heart healthy nutrition. Review and explanation with models, verbal and written materials for utilization of the food label.   Exercise Physiology & General Exercise Guidelines: - Group verbal and written instruction with models to review the exercise physiology of the cardiovascular system and associated critical values. Provides general exercise guidelines with specific guidelines to those with heart or lung disease.    Aerobic Exercise & Resistance Training: - Gives group verbal and written instruction on the various components of exercise. Focuses on aerobic and resistive training programs and the benefits of this training and how to safely progress through these programs.   Flexibility, Balance, Mind/Body Relaxation: Provides group verbal/written instruction on the benefits of flexibility and balance training, including mind/body exercise modes such as yoga, pilates and tai chi.  Demonstration and skill practice provided.   Stress and Anxiety: - Provides group verbal and written instruction about the health risks of elevated stress and causes of high stress.  Discuss the correlation between heart/lung disease and anxiety and treatment options. Review healthy ways to manage  with stress and anxiety.   Depression: - Provides group verbal and written instruction on the correlation between heart/lung disease and depressed mood, treatment options, and the stigmas associated with seeking treatment.   Exercise & Equipment Safety: - Individual verbal instruction and demonstration of equipment use and safety with use of the equipment.   Pulmonary Rehab from 01/02/2018 in [facility] Cardiac and Pulmonary Rehab  Date  01/02/18  Educator  [facility]  Instruction Review Code  1- Verbalizes Understanding      Infection Prevention: - Provides verbal and written material to  individual with discussion of infection control including proper hand washing and proper equipment cleaning during exercise session.   Pulmonary Rehab from 01/02/2018 in [facility] Cardiac and Pulmonary Rehab  Date  01/02/18  Educator  [facility]  Instruction Review Code  1- Verbalizes Understanding      Falls Prevention: - Provides verbal and written material to individual with discussion of falls prevention and safety.   Pulmonary Rehab from 01/02/2018 in [facility] Cardiac and Pulmonary Rehab  Date  01/02/18  Educator  [facility]  Instruction Review Code  1- Verbalizes Understanding      Diabetes: - Individual verbal and written instruction to review signs/symptoms of diabetes, desired ranges of glucose level fasting, after meals and with exercise. Advice that pre and post exercise glucose checks will be done for 3 sessions at entry of program.   Chronic Lung Diseases: - Group verbal and written instruction to review updates, respiratory medications, advancements in procedures and treatments. Discuss use of supplemental oxygen including available portable oxygen systems, continuous and intermittent flow rates, concentrators, personal use and safety guidelines. Review proper use of inhaler and spacers. Provide informative websites for self-education.    Energy Conservation: - Provide group verbal and written instruction for methods  to conserve energy, plan and organize activities. Instruct on pacing techniques, use of adaptive equipment and posture/positioning to relieve shortness of breath.   Triggers and Exacerbations: - Group verbal and written instruction to review types of environmental triggers and ways to prevent exacerbations. Discuss weather changes, air quality and the benefits of nasal washing. Review warning signs and symptoms to help prevent infections. Discuss techniques for effective airway clearance, coughing, and vibrations.   AED/CPR: - Group verbal and written instruction with the use of models to demonstrate the basic use of the AED with the basic ABC's of resuscitation.   Anatomy and Physiology of the Lungs: - Group verbal and written instruction with the use of models to provide basic lung anatomy and physiology related to function, structure and complications of lung disease.   Anatomy & Physiology of the Heart: - Group verbal and written instruction and models provide basic cardiac anatomy and physiology, with the coronary electrical and arterial systems. Review of Valvular disease and Heart Failure   Cardiac Medications: - Group verbal and written instruction to review commonly prescribed medications for heart disease. Reviews the medication, class of the drug, and side effects.   Know Your Numbers and Risk Factors: -Group verbal and written instruction about important numbers in your health.  Discussion of what are risk factors and how they play a role in the disease process.  Review of Cholesterol, Blood Pressure, Diabetes, and BMI and the role they play in your overall health.   Sleep Hygiene: -Provides group verbal and written instruction about how sleep can affect your health.  Define sleep hygiene, discuss sleep cycles and impact of sleep habits. Review good sleep hygiene tips.    Other: -Provides group and verbal instruction on various topics (see comments)   Pulmonary Rehab from  10/02/2017 in [facility] Cardiac and Pulmonary Rehab  Date  09/27/17  Educator  [facility]  Instruction Review Code  1- Verbalizes Understanding       Knowledge Questionnaire Score: Knowledge Questionnaire Score - 01/02/18 1119      Knowledge Questionnaire Score   Pre Score  13/18 reviewed with patient        Core Components/Risk Factors/Patient Goals at Admission: Personal Goals and Risk Factors at Admission - 01/02/18 1123  Core Components/Risk Factors/Patient Goals on Admission    Weight Management  Yes;Weight Loss    Intervention  Weight Management: Develop a combined nutrition and exercise program designed to reach desired caloric intake, while maintaining appropriate intake of nutrient and fiber, sodium and fats, and appropriate energy expenditure required for the weight goal.;Weight Management: Provide education and appropriate resources to help participant work on and attain dietary goals.;Weight Management/Obesity: Establish reasonable short term and long term weight goals.    Admit Weight  153 lb 9.6 oz (69.7 kg)    Goal Weight: Short Term  148 lb (67.1 kg)    Goal Weight: Long Term  148 lb (67.1 kg)    Expected Outcomes  Short Term: Continue to assess and modify interventions until short term weight is achieved;Long Term: Adherence to nutrition and physical activity/exercise program aimed toward attainment of established weight goal;Weight Loss: Understanding of general recommendations for a balanced deficit meal plan, which promotes 1-2 lb weight loss per week and includes a negative energy balance of [id number] kcal/d;Understanding recommendations for meals to include 15-35% energy as protein, 25-35% energy from fat, 35-60% energy from carbohydrates, less than 267m of dietary cholesterol, 20-35 gm of total fiber daily;Understanding of distribution of calorie intake throughout the day with the consumption of 4-5 meals/snacks;Weight Maintenance: Understanding of the daily nutrition  guidelines, which includes 25-35% calories from fat, 7% or less cal from saturated fats, less than 2036mcholesterol, less than 1.5gm of sodium, & 5 or more servings of fruits and vegetables daily    Improve shortness of breath with ADL's  Yes    Intervention  Provide education, individualized exercise plan and daily activity instruction to help decrease symptoms of SOB with activities of daily living.    Expected Outcomes  Short Term: Improve cardiorespiratory fitness to achieve a reduction of symptoms when performing ADLs;Long Term: Be able to perform more ADLs without symptoms or delay the onset of symptoms    Hypertension  Yes    Intervention  Provide education on lifestyle modifcations including regular physical activity/exercise, weight management, moderate sodium restriction and increased consumption of fresh fruit, vegetables, and low fat dairy, alcohol moderation, and smoking cessation.;Monitor prescription use compliance.    Expected Outcomes  Short Term: Continued assessment and intervention until BP is < 140/9062mG in hypertensive participants. < 130/21m96m in hypertensive participants with diabetes, heart failure or chronic kidney disease.;Long Term: Maintenance of blood pressure at goal levels.    Lipids  Yes    Intervention  Provide education and support for participant on nutrition & aerobic/resistive exercise along with prescribed medications to achieve LDL <70mg11mL >40mg.35mExpected Outcomes  Short Term: Participant states understanding of desired cholesterol values and is compliant with medications prescribed. Participant is following exercise prescription and nutrition guidelines.;Long Term: Cholesterol controlled with medications as prescribed, with individualized exercise RX and with personalized nutrition plan. Value goals: LDL < 70mg, 37m> 40 mg.       Core Components/Risk Factors/Patient Goals Review:    Core Components/Risk Factors/Patient Goals at Discharge (Final  Review):    ITP Comments: ITP Comments    Row Name 09/18/17 1456 11/06/17 0944 11/09/17 1129 01/02/18 1105     ITP Comments  Medical Evaluation completed. Chart sent for review and changes to Dr. Mark MiEmily Filbertor of [facility]  Alissia states that she will be back on Friday March 8th. [place]  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  Once removed, she will need to be out for 3 weeks.  This is scheduled for the week of 3/18.  With all of this going on, we talked about her best plan of action and decided to discharge at this time.  She plans to return in late April or early May.  Discharge ITP created and sent..  Medical Evaluation completed. Chart sent for review and changes to Dr. Emily Filbert Director of [place]. Diagnosis can be found in [facility] encounter 12/15/17       Comments: Initial ITP

## 2018-01-03 ENCOUNTER — Encounter: Payer: Medicare Other | Attending: Pulmonary Disease

## 2018-01-03 DIAGNOSIS — J4540 Moderate persistent asthma, uncomplicated: Secondary | ICD-10-CM | POA: Insufficient documentation

## 2018-01-03 DIAGNOSIS — K219 Gastro-esophageal reflux disease without esophagitis: Secondary | ICD-10-CM | POA: Diagnosis not present

## 2018-01-03 DIAGNOSIS — Z79899 Other long term (current) drug therapy: Secondary | ICD-10-CM | POA: Diagnosis not present

## 2018-01-03 DIAGNOSIS — I739 Peripheral vascular disease, unspecified: Secondary | ICD-10-CM | POA: Insufficient documentation

## 2018-01-03 DIAGNOSIS — I10 Essential (primary) hypertension: Secondary | ICD-10-CM | POA: Insufficient documentation

## 2018-01-03 DIAGNOSIS — J449 Chronic obstructive pulmonary disease, unspecified: Secondary | ICD-10-CM

## 2018-01-03 NOTE — Progress Notes (Signed)
Daily Session Note  Patient Details  Name: Kim Owen MRN: [id number] Date of Birth: Oct 05, 1934 Referring Provider:     Pulmonary Rehab from 01/02/2018 in [facility] Cardiac and Pulmonary Rehab  Referring Provider  Liane Comber      Encounter Date: 01/03/2018  Check In: Session Check In - 01/03/18 1114      Check-In   Location  [facility]-Cardiac & Pulmonary Rehab    Staff Present  Justin Mend RCP,RRT,BSRT;Meredith Sherryll Burger, RN BSN;Jessica Luan Pulling, MA, RCEP, CCRP, Exercise Physiologist    Supervising physician immediately available to respond to emergencies  [facility] immediately available ER MD    Physician(s)  Dr. Burlene Arnt and Alfred Levins    Medication changes reported      No    Fall or balance concerns reported     No    Tobacco Cessation  No Change    Warm-up and Cool-down  Performed as group-led instruction    Resistance Training Performed  Yes    VAD Patient?  No      Pain Assessment   Currently in Pain?  No/denies          Social History   Tobacco Use  Smoking Status Never Smoker  Smokeless Tobacco Never Used    Goals Met:  Independence with exercise equipment Exercise tolerated well No report of cardiac concerns or symptoms Strength training completed today  Goals Unmet:  Not Applicable  Comments: First full day of exercise!  Patient was oriented to gym and equipment including functions, settings, policies, and procedures.  Patient's individual exercise prescription and treatment plan were reviewed.  All starting workloads were established based on the results of the 6 minute walk test done at initial orientation visit.  The plan for exercise progression was also introduced and progression will be customized based on patient's performance and goals.Pt able to follow exercise prescription today without complaint.  Will continue to monitor for progression.   Dr. Emily Filbert is Medical Director for [place] and [facility].

## 2018-01-05 ENCOUNTER — Encounter: Payer: Medicare Other | Admitting: *Deleted

## 2018-01-05 DIAGNOSIS — J449 Chronic obstructive pulmonary disease, unspecified: Secondary | ICD-10-CM

## 2018-01-05 DIAGNOSIS — J4540 Moderate persistent asthma, uncomplicated: Secondary | ICD-10-CM

## 2018-01-05 NOTE — Progress Notes (Signed)
Daily Session Note  Patient Details  Name: Kim Owen MRN: [id number] Date of Birth: 1934/09/21 Referring Provider:     Pulmonary Rehab from 01/02/2018 in [facility] Cardiac and Pulmonary Rehab  Referring Provider  Liane Comber      Encounter Date: 01/05/2018  Check In: Session Check In - 01/05/18 1202      Check-In   Location  [facility]-Cardiac & Pulmonary Rehab    Staff Present  Renita Papa, RN BSN;Jessica Luan Pulling, MA, RCEP, CCRP, Exercise Physiologist;Joseph Flavia Shipper    Supervising physician immediately available to respond to emergencies  [facility] immediately available ER MD    Physician(s)   Dr. Alfred Levins and Siadecki    Medication changes reported      No    Fall or balance concerns reported     No    Tobacco Cessation  No Change    Warm-up and Cool-down  Performed as group-led instruction    Resistance Training Performed  Yes    VAD Patient?  No      Pain Assessment   Currently in Pain?  No/denies          Social History   Tobacco Use  Smoking Status Never Smoker  Smokeless Tobacco Never Used    Goals Met:  Proper associated with RPD/PD & O2 Sat Independence with exercise equipment Using PLB without cueing & demonstrates good technique Exercise tolerated well No report of cardiac concerns or symptoms Strength training completed today  Goals Unmet:  Not Applicable  Comments: Reviewed home exercise with pt today.  Pt plans to walk at [facility] with friend for exercise.  Reviewed THR, pulse, RPE, sign and symptoms, NTG use, and when to call 911 or MD.  Also discussed weather considerations and indoor options.  Pt voiced understanding.   Dr. Emily Filbert is Medical Director for [place] and [facility].

## 2018-01-08 DIAGNOSIS — J449 Chronic obstructive pulmonary disease, unspecified: Secondary | ICD-10-CM

## 2018-01-08 DIAGNOSIS — J4540 Moderate persistent asthma, uncomplicated: Secondary | ICD-10-CM | POA: Diagnosis not present

## 2018-01-08 NOTE — Progress Notes (Signed)
Pulmonary Individual Treatment Plan  Patient Details  Name: Kim Owen MRN: [id number] Date of Birth: 24-Jun-1935 Referring Provider:     Pulmonary Rehab from 01/02/2018 in [facility] Cardiac and Pulmonary Rehab  Referring Provider  Liane Comber      Initial Encounter Date:    Pulmonary Rehab from 01/02/2018 in [facility] Cardiac and Pulmonary Rehab  Date  01/02/18  Referring Provider  Liane Comber      Visit Diagnosis: Chronic obstructive pulmonary disease, unspecified COPD type ([place])  Patient's Home Medications on Admission:  Current Outpatient Medications:  .  albuterol (PROVENTIL HFA;VENTOLIN HFA) 108 (90 Base) MCG/ACT inhaler, Inhale 2 puffs into the lungs every 6 (six) hours as needed for wheezing or shortness of breath., Disp: , Rfl:  .  amLODipine (NORVASC) 5 MG tablet, Take 5 mg by mouth daily., Disp: , Rfl:  .  azelastine (ASTELIN) 0.1 % nasal spray, Place 2 sprays into both nostrils 2 (two) times daily. Use in each nostril as directed, Disp: , Rfl:  .  diclofenac sodium (VOLTAREN) 1 % GEL, Apply 2 g topically 4 (four) times daily., Disp: , Rfl:  .  esomeprazole (NEXIUM) 40 MG capsule, Take 40 mg by mouth daily at 12 noon., Disp: , Rfl:  .  Fluticasone-Salmeterol (ADVAIR) 250-50 MCG/DOSE AEPB, Inhale 1 puff into the lungs 2 (two) times daily., Disp: , Rfl:  .  HYDROcodone-acetaminophen (NORCO/VICODIN) 5-325 MG tablet, 1-2 tabs po qd prn severe pain, Disp: 6 tablet, Rfl: 0 .  levothyroxine (SYNTHROID, LEVOTHROID) 25 MCG tablet, Take 25 mcg by mouth daily before breakfast., Disp: , Rfl:  .  MOMETASONE FUROATE NA, Place into the nose., Disp: , Rfl:  .  montelukast (SINGULAIR) 10 MG tablet, Take 10 mg by mouth at bedtime., Disp: , Rfl:  .  predniSONE (DELTASONE) 20 MG tablet, 2 tabs po qd for 2 days, then 1 tab po qd for 2 days, then half at tab po qd for 2 days, Disp: 7 tablet, Rfl: 0 .  SUMAtriptan (IMITREX) 50 MG tablet, Take 50 mg by mouth every 2 (two) hours as needed for migraine. May  repeat in 2 hours if headache persists or recurs., Disp: , Rfl:  .  tiotropium (SPIRIVA) 18 MCG inhalation capsule, Place 18 mcg into inhaler and inhale daily., Disp: , Rfl:  .  topiramate (TOPAMAX) 50 MG tablet, Take 50 mg by mouth 2 (two) times daily., Disp: , Rfl:   Past Medical History: Past Medical History:  Diagnosis Date  . Asthma   . Barrett esophagus   . Cataract   . Colitis   . Fatty liver   . GERD (gastroesophageal reflux disease)   . Herpes zoster   . Hypertension   . PVD (peripheral vascular disease) ([place])     Tobacco Use: Social History   Tobacco Use  Smoking Status Never Smoker  Smokeless Tobacco Never Used    Labs: Recent Review Flowsheet Data    There is no flowsheet data to display.       Pulmonary Assessment Scores: Pulmonary Assessment Scores    Row Name 01/02/18 1116         ADL UCSD   ADL Phase  Entry     SOB Score total  63     Rest  0     Walk  3     Stairs  4     Bath  2     Dress  2     Shop  2  CAT Score   CAT Score  26       mMRC Score   mMRC Score  3        Pulmonary Function Assessment: Pulmonary Function Assessment - 01/02/18 1108      Pulmonary Function Tests   FVC%  116.9 % PFT done on 01/13/17    FEV1%  109.8 %    FEV1/FVC Ratio  70.01      Breath   Bilateral Breath Sounds  Clear    Shortness of Breath  Limiting activity;Yes       Exercise Target Goals:    Exercise Program Goal: Individual exercise prescription set using results from initial 6 min walk test and THRR while considering  patient's activity barriers and safety.    Exercise Prescription Goal: Initial exercise prescription builds to 30-45 minutes a day of aerobic activity, 2-3 days per week.  Home exercise guidelines will be given to patient during program as part of exercise prescription that the participant will acknowledge.  Activity Barriers & Risk Stratification:   6 Minute Walk: 6 Minute Walk    Row Name 01/02/18 1139          6 Minute Walk   Distance  960 feet     Walk Time  6 minutes     # of Rest Breaks  0     MPH  1.82     METS  2.04     RPE  16     Perceived Dyspnea   4     VO2 Peak  7.15     Symptoms  No     Resting HR  75 bpm     Resting BP  126/52     Resting Oxygen Saturation   100 %     Exercise Oxygen Saturation  during 6 min walk  98 %     Max Ex. HR  99 bpm     Max Ex. BP  160/60     2 Minute Post BP  132/60       Interval HR   1 Minute HR  90     2 Minute HR  94     4 Minute HR  91     5 Minute HR  96     6 Minute HR  99     2 Minute Post HR  84     Interval Heart Rate?  Yes       Interval Oxygen   Interval Oxygen?  Yes     Baseline Oxygen Saturation %  100 %     1 Minute Oxygen Saturation %  100 %     1 Minute Liters of Oxygen  0 L     2 Minute Oxygen Saturation %  99 %     2 Minute Liters of Oxygen  0 L     3 Minute Liters of Oxygen  0 L     4 Minute Oxygen Saturation %  100 %     4 Minute Liters of Oxygen  0 L     5 Minute Oxygen Saturation %  100 %     5 Minute Liters of Oxygen  0 L     6 Minute Oxygen Saturation %  99 %     6 Minute Liters of Oxygen  0 L     2 Minute Post Oxygen Saturation %  98 %     2 Minute Post Liters of Oxygen  0 L  Oxygen Initial Assessment: Oxygen Initial Assessment - 01/02/18 1122      Home Oxygen   Home Oxygen Device  None    Sleep Oxygen Prescription  None    Home Exercise Oxygen Prescription  None    Home at Rest Exercise Oxygen Prescription  None      Initial 6 min Walk   Oxygen Used  None      Program Oxygen Prescription   Program Oxygen Prescription  None      Intervention   Short Term Goals  To learn and demonstrate proper use of respiratory medications;To learn and demonstrate proper pursed lip breathing techniques or other breathing techniques.;To learn and understand importance of maintaining oxygen saturations>88%;To learn and understand importance of monitoring SPO2 with pulse oximeter and demonstrate accurate use  of the pulse oximeter.    Long  Term Goals  Demonstrates proper use of MDI's;Compliance with respiratory medication;Exhibits proper breathing techniques, such as pursed lip breathing or other method taught during program session;Maintenance of O2 saturations>88%;Verbalizes importance of monitoring SPO2 with pulse oximeter and return demonstration       Oxygen Re-Evaluation: Oxygen Re-Evaluation    Row Name 01/03/18 1117             Program Oxygen Prescription   Program Oxygen Prescription  None         Home Oxygen   Home Oxygen Device  None       Sleep Oxygen Prescription  None       Home Exercise Oxygen Prescription  None       Home at Rest Exercise Oxygen Prescription  None         Goals/Expected Outcomes   Short Term Goals  To learn and demonstrate proper use of respiratory medications;To learn and demonstrate proper pursed lip breathing techniques or other breathing techniques.;To learn and understand importance of maintaining oxygen saturations>88%;To learn and understand importance of monitoring SPO2 with pulse oximeter and demonstrate accurate use of the pulse oximeter.       Long  Term Goals  Demonstrates proper use of MDI's;Compliance with respiratory medication;Exhibits proper breathing techniques, such as pursed lip breathing or other method taught during program session;Maintenance of O2 saturations>88%;Verbalizes importance of monitoring SPO2 with pulse oximeter and return demonstration       Comments  Reviewed PLB technique with pt.  Talked about how it work and it's important to maintaining his exercise saturations.         Goals/Expected Outcomes  Short: Become more profiecient at using PLB.   Long: Become independent at using PLB.          Oxygen Discharge (Final Oxygen Re-Evaluation): Oxygen Re-Evaluation - 01/03/18 1117      Program Oxygen Prescription   Program Oxygen Prescription  None      Home Oxygen   Home Oxygen Device  None    Sleep Oxygen Prescription   None    Home Exercise Oxygen Prescription  None    Home at Rest Exercise Oxygen Prescription  None      Goals/Expected Outcomes   Short Term Goals  To learn and demonstrate proper use of respiratory medications;To learn and demonstrate proper pursed lip breathing techniques or other breathing techniques.;To learn and understand importance of maintaining oxygen saturations>88%;To learn and understand importance of monitoring SPO2 with pulse oximeter and demonstrate accurate use of the pulse oximeter.    Long  Term Goals  Demonstrates proper use of MDI's;Compliance with respiratory medication;Exhibits proper breathing techniques, such as pursed  lip breathing or other method taught during program session;Maintenance of O2 saturations>88%;Verbalizes importance of monitoring SPO2 with pulse oximeter and return demonstration    Comments  Reviewed PLB technique with pt.  Talked about how it work and it's important to maintaining his exercise saturations.      Goals/Expected Outcomes  Short: Become more profiecient at using PLB.   Long: Become independent at using PLB.       Initial Exercise Prescription: Initial Exercise Prescription - 01/02/18 1100      Date of Initial Exercise RX and Referring Provider   Date  01/02/18    Referring Provider  Liane Comber      Treadmill   MPH  1.3    Grade  0.5    Minutes  15    METs  2.09      NuStep   Level  2    SPM  80    Minutes  15    METs  2      Arm Ergometer   Level  1    Watts  22    RPM  25    Minutes  15    METs  2      Biostep-RELP   Level  2    SPM  45    Minutes  15    METs  2      Prescription Details   Frequency (times per week)  3    Duration  Progress to 45 minutes of aerobic exercise without signs/symptoms of physical distress      Intensity   THRR 40-80% of Max Heartrate  101-126    Ratings of Perceived Exertion  11-13    Perceived Dyspnea  0-4      Resistance Training   Training Prescription  Yes    Weight  3 lb     Reps  10-15       Perform Capillary Blood Glucose checks as needed.  Exercise Prescription Changes: Exercise Prescription Changes    Row Name 01/05/18 1200             Home Exercise Plan   Plans to continue exercise at  Home (comment) walk with friend at [facility]       Frequency  Add 3 additional days to program exercise sessions.       Initial Home Exercises Provided  01/05/18 [facility]          Exercise Comments: Exercise Comments    Row Name 01/03/18 1116           Exercise Comments  First full day of exercise!  Patient was oriented to gym and equipment including functions, settings, policies, and procedures.  Patient's individual exercise prescription and treatment plan were reviewed.  All starting workloads were established based on the results of the 6 minute walk test done at initial orientation visit.  The plan for exercise progression was also introduced and progression will be customized based on patient's performance and goals.          Exercise Goals and Review: Exercise Goals    Row Name 01/02/18 1139             Exercise Goals   Increase Physical Activity  Yes       Intervention  Provide advice, education, support and counseling about physical activity/exercise needs.;Develop an individualized exercise prescription for aerobic and resistive training based on initial evaluation findings, risk stratification, comorbidities and participant's personal goals.  Expected Outcomes  Short Term: Attend rehab on a regular basis to increase amount of physical activity.;Long Term: Add in home exercise to make exercise part of routine and to increase amount of physical activity.;Long Term: Exercising regularly at least 3-5 days a week.       Increase Strength and Stamina  Yes       Intervention  Provide advice, education, support and counseling about physical activity/exercise needs.;Develop an individualized exercise prescription for aerobic and resistive  training based on initial evaluation findings, risk stratification, comorbidities and participant's personal goals.       Expected Outcomes  Short Term: Increase workloads from initial exercise prescription for resistance, speed, and METs.;Short Term: Perform resistance training exercises routinely during rehab and add in resistance training at home;Long Term: Improve cardiorespiratory fitness, muscular endurance and strength as measured by increased METs and functional capacity (6MWT)       Able to understand and use rate of perceived exertion (RPE) scale  Yes       Intervention  Provide education and explanation on how to use RPE scale       Expected Outcomes  Short Term: Able to use RPE daily in rehab to express subjective intensity level;Long Term:  Able to use RPE to guide intensity level when exercising independently       Able to understand and use Dyspnea scale  Yes       Intervention  Provide education and explanation on how to use Dyspnea scale       Expected Outcomes  Short Term: Able to use Dyspnea scale daily in rehab to express subjective sense of shortness of breath during exertion;Long Term: Able to use Dyspnea scale to guide intensity level when exercising independently       Knowledge and understanding of Target Heart Rate Range (THRR)  Yes       Intervention  Provide education and explanation of THRR including how the numbers were predicted and where they are located for reference       Expected Outcomes  Short Term: Able to state/look up THRR;Short Term: Able to use daily as guideline for intensity in rehab;Long Term: Able to use THRR to govern intensity when exercising independently       Able to check pulse independently  Yes       Intervention  Provide education and demonstration on how to check pulse in carotid and radial arteries.;Review the importance of being able to check your own pulse for safety during independent exercise       Expected Outcomes  Short Term: Able to  explain why pulse checking is important during independent exercise;Long Term: Able to check pulse independently and accurately       Understanding of Exercise Prescription  Yes       Intervention  Provide education, explanation, and written materials on patient's individual exercise prescription       Expected Outcomes  Short Term: Able to explain program exercise prescription;Long Term: Able to explain home exercise prescription to exercise independently          Exercise Goals Re-Evaluation : Exercise Goals Re-Evaluation    Row Name 01/03/18 1116 01/05/18 1233           Exercise Goal Re-Evaluation   Exercise Goals Review  Understanding of Exercise Prescription;Able to understand and use Dyspnea scale;Knowledge and understanding of Target Heart Rate Range (THRR);Able to understand and use rate of perceived exertion (RPE) scale  Increase Physical Activity;Able to understand and use Dyspnea  scale;Understanding of Exercise Prescription;Increase Strength and Stamina;Knowledge and understanding of Target Heart Rate Range (THRR);Able to understand and use rate of perceived exertion (RPE) scale;Able to check pulse independently      Comments  Reviewed RPE scale, THR and program prescription with pt today.  Pt voiced understanding and was given a copy of goals to take home.   Reviewed home exercise with pt today.  Pt plans to walk at [facility] with friend for exercise.  Reviewed THR, pulse, RPE, sign and symptoms, NTG use, and when to call 911 or MD.  Also discussed weather considerations and indoor options.  Pt voiced understanding.      Expected Outcomes  Short: Use RPE daily to regulate intensity.  Long: Follow program prescription in THR.  Short: Start with 3 days walking with friend.  Long: Continue to exercise independently         Discharge Exercise Prescription (Final Exercise Prescription Changes): Exercise Prescription Changes - 01/05/18 1200      Home Exercise Plan   Plans  to continue exercise at  Home (comment) walk with friend at [facility]    Frequency  Add 3 additional days to program exercise sessions.    Initial Home Exercises Provided  01/05/18 [facility]       Nutrition:  Target Goals: Understanding of nutrition guidelines, daily intake of sodium <1552m, cholesterol <2088m calories 30% from fat and 7% or less from saturated fats, daily to have 5 or more servings of fruits and vegetables.  Biometrics: Pre Biometrics - 01/02/18 1138      Pre Biometrics   Height  '5\' 6"'  (1.676 m)    Weight  153 lb 9.6 oz (69.7 kg)    Waist Circumference  36.25 inches    Hip Circumference  39 inches    Waist to Hip Ratio  0.93 %    BMI (Calculated)  24.8        Nutrition Therapy Plan and Nutrition Goals: Nutrition Therapy & Goals - 01/02/18 1115      Personal Nutrition Goals   Nutrition Goal  Learn to eat better and lose some weight.    Comments  She eats lot of vegetables and wants to learn to eat better.      Intervention Plan   Intervention  Prescribe, educate and counsel regarding individualized specific dietary modifications aiming towards targeted core components such as weight, hypertension, lipid management, diabetes, heart failure and other comorbidities.    Expected Outcomes  Short Term Goal: Understand basic principles of dietary content, such as calories, fat, sodium, cholesterol and nutrients.;Long Term Goal: Adherence to prescribed nutrition plan.       Nutrition Assessments: Nutrition Assessments - 01/02/18 1115      MEDFICTS Scores   Pre Score  27       Nutrition Goals Re-Evaluation:   Nutrition Goals Discharge (Final Nutrition Goals Re-Evaluation):   Psychosocial: Target Goals: Acknowledge presence or absence of significant depression and/or stress, maximize coping skills, provide positive support system. Participant is able to verbalize types and ability to use techniques and skills needed for reducing stress and  depression.   Initial Review & Psychosocial Screening: Initial Psych Review & Screening - 01/02/18 1115      Initial Review   Current issues with  Current Depression;Current Sleep Concerns;Current Stress Concerns    Source of Stress Concerns  Chronic Illness;Family;Unable to perform yard/household activities;Unable to participate in former interests or hobbies    Comments  Her family is still  having some issues but is working through it      [place]?  Yes    Comments  Her son, daughter in law and daughter.      Barriers   Psychosocial barriers to participate in program  The patient should benefit from training in stress management and relaxation.      Screening Interventions   Interventions  To provide support and resources with identified psychosocial needs;Program counselor consult;Encouraged to exercise;Provide feedback about the scores to participant    Expected Outcomes  Long Term goal: The participant improves quality of Life and PHQ9 Scores as seen by post scores and/or verbalization of changes;Short Term goal: Identification and review with participant of any Quality of Life or Depression concerns found by scoring the questionnaire.;Long Term Goal: Stressors or current issues are controlled or eliminated.;Short Term goal: Utilizing psychosocial counselor, staff and physician to assist with identification of specific Stressors or current issues interfering with healing process. Setting desired goal for each stressor or current issue identified.       Quality of Life Scores:  Scores of 19 and below usually indicate a poorer quality of life in these areas.  A difference of  2-3 points is a clinically meaningful difference.  A difference of 2-3 points in the total score of the Quality of Life Index has been associated with significant improvement in overall quality of life, self-image, physical symptoms, and general health in studies assessing change in  quality of life.  PHQ-9: Recent Review Flowsheet Data    Depression screen [facility] 2/9 01/02/2018 09/18/2017   Decreased Interest 2 3   Down, Depressed, Hopeless 1 2   PHQ - 2 Score 3 5   Altered sleeping 0 2   Tired, decreased energy 1 3   Change in appetite 0 0   Feeling bad or failure about yourself  1 1   Trouble concentrating 1 1   Moving slowly or fidgety/restless 0 1   Suicidal thoughts 0 0   PHQ-9 Score 6 13   Difficult doing work/chores Somewhat difficult Somewhat difficult     Interpretation of Total Score  Total Score Depression Severity:  1-4 = Minimal depression, 5-9 = Mild depression, 10-14 = Moderate depression, 15-19 = Moderately severe depression, 20-27 = Severe depression   Psychosocial Evaluation and Intervention:   Psychosocial Re-Evaluation:   Psychosocial Discharge (Final Psychosocial Re-Evaluation):   Education: Education Goals: Education classes will be provided on a weekly basis, covering required topics. Participant will state understanding/return demonstration of topics presented.  Learning Barriers/Preferences: Learning Barriers/Preferences - 01/02/18 1107      Learning Barriers/Preferences   Learning Barriers  Sight Wears reading glasses    Learning Preferences  None       Education Topics:  Initial Evaluation Education: - Verbal, written and demonstration of respiratory meds, oximetry and breathing techniques. Instruction on use of nebulizers and MDIs and importance of monitoring MDI activations.   Pulmonary Rehab from 01/03/2018 in [facility] Cardiac and Pulmonary Rehab  Date  01/02/18  Educator  [facility]  Instruction Review Code  1- Verbalizes Understanding      General Nutrition Guidelines/Fats and Fiber: -Group instruction provided by verbal, written material, models and posters to present the general guidelines for heart healthy nutrition. Gives an explanation and review of dietary fats and fiber.   Pulmonary Rehab from 10/02/2017 in [facility] Cardiac  and Pulmonary Rehab  Date  10/02/17  Educator  [facility]  Instruction Review Code  1-  Verbalizes Understanding      Controlling Sodium/Reading Food Labels: -Group verbal and written material supporting the discussion of sodium use in heart healthy nutrition. Review and explanation with models, verbal and written materials for utilization of the food label.   Exercise Physiology & General Exercise Guidelines: - Group verbal and written instruction with models to review the exercise physiology of the cardiovascular system and associated critical values. Provides general exercise guidelines with specific guidelines to those with heart or lung disease.    Aerobic Exercise & Resistance Training: - Gives group verbal and written instruction on the various components of exercise. Focuses on aerobic and resistive training programs and the benefits of this training and how to safely progress through these programs.   Flexibility, Balance, Mind/Body Relaxation: Provides group verbal/written instruction on the benefits of flexibility and balance training, including mind/body exercise modes such as yoga, pilates and tai chi.  Demonstration and skill practice provided.   Stress and Anxiety: - Provides group verbal and written instruction about the health risks of elevated stress and causes of high stress.  Discuss the correlation between heart/lung disease and anxiety and treatment options. Review healthy ways to manage with stress and anxiety.   Depression: - Provides group verbal and written instruction on the correlation between heart/lung disease and depressed mood, treatment options, and the stigmas associated with seeking treatment.   Exercise & Equipment Safety: - Individual verbal instruction and demonstration of equipment use and safety with use of the equipment.   Pulmonary Rehab from 01/03/2018 in [facility] Cardiac and Pulmonary Rehab  Date  01/02/18  Educator  [facility]  Instruction Review Code  1-  Verbalizes Understanding      Infection Prevention: - Provides verbal and written material to individual with discussion of infection control including proper hand washing and proper equipment cleaning during exercise session.   Pulmonary Rehab from 01/03/2018 in [facility] Cardiac and Pulmonary Rehab  Date  01/02/18  Educator  [facility]  Instruction Review Code  1- Verbalizes Understanding      Falls Prevention: - Provides verbal and written material to individual with discussion of falls prevention and safety.   Pulmonary Rehab from 01/03/2018 in [facility] Cardiac and Pulmonary Rehab  Date  01/02/18  Educator  [facility]  Instruction Review Code  1- Verbalizes Understanding      Diabetes: - Individual verbal and written instruction to review signs/symptoms of diabetes, desired ranges of glucose level fasting, after meals and with exercise. Advice that pre and post exercise glucose checks will be done for 3 sessions at entry of program.   Chronic Lung Diseases: - Group verbal and written instruction to review updates, respiratory medications, advancements in procedures and treatments. Discuss use of supplemental oxygen including available portable oxygen systems, continuous and intermittent flow rates, concentrators, personal use and safety guidelines. Review proper use of inhaler and spacers. Provide informative websites for self-education.    Energy Conservation: - Provide group verbal and written instruction for methods to conserve energy, plan and organize activities. Instruct on pacing techniques, use of adaptive equipment and posture/positioning to relieve shortness of breath.   Triggers and Exacerbations: - Group verbal and written instruction to review types of environmental triggers and ways to prevent exacerbations. Discuss weather changes, air quality and the benefits of nasal washing. Review warning signs and symptoms to help prevent infections. Discuss techniques for effective airway clearance,  coughing, and vibrations.   AED/CPR: - Group verbal and written instruction with the use of models to demonstrate the basic  use of the AED with the basic ABC's of resuscitation.   Anatomy and Physiology of the Lungs: - Group verbal and written instruction with the use of models to provide basic lung anatomy and physiology related to function, structure and complications of lung disease.   Anatomy & Physiology of the Heart: - Group verbal and written instruction and models provide basic cardiac anatomy and physiology, with the coronary electrical and arterial systems. Review of Valvular disease and Heart Failure   Pulmonary Rehab from 01/03/2018 in [facility] Cardiac and Pulmonary Rehab  Date  01/03/18  Educator  [facility]  Instruction Review Code  1- Verbalizes Understanding      Cardiac Medications: - Group verbal and written instruction to review commonly prescribed medications for heart disease. Reviews the medication, class of the drug, and side effects.   Know Your Numbers and Risk Factors: -Group verbal and written instruction about important numbers in your health.  Discussion of what are risk factors and how they play a role in the disease process.  Review of Cholesterol, Blood Pressure, Diabetes, and BMI and the role they play in your overall health.   Sleep Hygiene: -Provides group verbal and written instruction about how sleep can affect your health.  Define sleep hygiene, discuss sleep cycles and impact of sleep habits. Review good sleep hygiene tips.    Other: -Provides group and verbal instruction on various topics (see comments)   Pulmonary Rehab from 10/02/2017 in [facility] Cardiac and Pulmonary Rehab  Date  09/27/17  Educator  [facility]  Instruction Review Code  1- Verbalizes Understanding       Knowledge Questionnaire Score: Knowledge Questionnaire Score - 01/02/18 1119      Knowledge Questionnaire Score   Pre Score  13/18 reviewed with patient        Core Components/Risk  Factors/Patient Goals at Admission: Personal Goals and Risk Factors at Admission - 01/02/18 1123      Core Components/Risk Factors/Patient Goals on Admission    Weight Management  Yes;Weight Loss    Intervention  Weight Management: Develop a combined nutrition and exercise program designed to reach desired caloric intake, while maintaining appropriate intake of nutrient and fiber, sodium and fats, and appropriate energy expenditure required for the weight goal.;Weight Management: Provide education and appropriate resources to help participant work on and attain dietary goals.;Weight Management/Obesity: Establish reasonable short term and long term weight goals.    Admit Weight  153 lb 9.6 oz (69.7 kg)    Goal Weight: Short Term  148 lb (67.1 kg)    Goal Weight: Long Term  148 lb (67.1 kg)    Expected Outcomes  Short Term: Continue to assess and modify interventions until short term weight is achieved;Long Term: Adherence to nutrition and physical activity/exercise program aimed toward attainment of established weight goal;Weight Loss: Understanding of general recommendations for a balanced deficit meal plan, which promotes 1-2 lb weight loss per week and includes a negative energy balance of [phone] kcal/d;Understanding recommendations for meals to include 15-35% energy as protein, 25-35% energy from fat, 35-60% energy from carbohydrates, less than 248m of dietary cholesterol, 20-35 gm of total fiber daily;Understanding of distribution of calorie intake throughout the day with the consumption of 4-5 meals/snacks;Weight Maintenance: Understanding of the daily nutrition guidelines, which includes 25-35% calories from fat, 7% or less cal from saturated fats, less than 2070mcholesterol, less than 1.5gm of sodium, & 5 or more servings of fruits and vegetables daily    Improve shortness of breath with ADL's  Yes    Intervention  Provide education, individualized exercise plan and daily activity instruction  to help decrease symptoms of SOB with activities of daily living.    Expected Outcomes  Short Term: Improve cardiorespiratory fitness to achieve a reduction of symptoms when performing ADLs;Long Term: Be able to perform more ADLs without symptoms or delay the onset of symptoms    Hypertension  Yes    Intervention  Provide education on lifestyle modifcations including regular physical activity/exercise, weight management, moderate sodium restriction and increased consumption of fresh fruit, vegetables, and low fat dairy, alcohol moderation, and smoking cessation.;Monitor prescription use compliance.    Expected Outcomes  Short Term: Continued assessment and intervention until BP is < 140/64m HG in hypertensive participants. < 130/845mHG in hypertensive participants with diabetes, heart failure or chronic kidney disease.;Long Term: Maintenance of blood pressure at goal levels.    Lipids  Yes    Intervention  Provide education and support for participant on nutrition & aerobic/resistive exercise along with prescribed medications to achieve LDL <7041mHDL >83m66m  Expected Outcomes  Short Term: Participant states understanding of desired cholesterol values and is compliant with medications prescribed. Participant is following exercise prescription and nutrition guidelines.;Long Term: Cholesterol controlled with medications as prescribed, with individualized exercise RX and with personalized nutrition plan. Value goals: LDL < 70mg25mL > 40 mg.       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  Once removed, she will need to be out for 3 weeks.  This is scheduled for the week of 3/18.  With all of  this going on, we talked about her best plan of action and decided to discharge at this time.  She plans to return in late April or early May.  Discharge ITP created and sent..  Medical Evaluation completed. Chart sent for review and changes to Dr. Mark Emily Filbertctor of LungWJewellgnosis can be found in [facility] encounter 12/15/17   30 day review completed. ITP sent to Dr. Mark Emily Filbertctor of [facility] with ITP unless changes are made by physician        Comments: 30 day review

## 2018-01-08 NOTE — Progress Notes (Signed)
Daily Session Note  Patient Details  Name: Kim Owen MRN: [id number] Date of Birth: 08-08-1935 Referring Provider:     Pulmonary Rehab from 01/02/2018 in [facility] Cardiac and Pulmonary Rehab  Referring Provider  Liane Comber      Encounter Date: 01/08/2018  Check In: Session Check In - 01/08/18 1137      Check-In   Location  [facility]-Cardiac & Pulmonary Rehab    Staff Present  Earlean Shawl, BS, ACSM CEP, Exercise Physiologist;Amanda Oletta Darter, BA, ACSM CEP, Exercise Physiologist;Basheer Molchan Flavia Shipper    Supervising physician immediately available to respond to emergencies  [facility] immediately available ER MD    Physician(s)  Dr. Kerman Passey and Rifenbark    Medication changes reported      No    Fall or balance concerns reported     No    Tobacco Cessation  No Change    Warm-up and Cool-down  Performed as group-led instruction    Resistance Training Performed  Yes    VAD Patient?  No      Pain Assessment   Currently in Pain?  No/denies          Social History   Tobacco Use  Smoking Status Never Smoker  Smokeless Tobacco Never Used    Goals Met:  Independence with exercise equipment Exercise tolerated well No report of cardiac concerns or symptoms Strength training completed today  Goals Unmet:  Not Applicable  Comments: Pt able to follow exercise prescription today without complaint.  Will continue to monitor for progression.   Dr. Emily Filbert is Medical Director for [place] and [facility].

## 2018-01-10 ENCOUNTER — Encounter: Payer: Medicare Other | Admitting: *Deleted

## 2018-01-10 DIAGNOSIS — J449 Chronic obstructive pulmonary disease, unspecified: Secondary | ICD-10-CM

## 2018-01-10 DIAGNOSIS — J4540 Moderate persistent asthma, uncomplicated: Secondary | ICD-10-CM

## 2018-01-10 NOTE — Progress Notes (Signed)
Daily Session Note  Patient Details  Name: Kim Owen MRN: [id number] Date of Birth: 08-10-1935 Referring Provider:     Pulmonary Rehab from 01/02/2018 in [facility] Cardiac and Pulmonary Rehab  Referring Provider  Liane Comber      Encounter Date: 01/10/2018  Check In: Session Check In - 01/10/18 1115      Check-In   Location  [facility]-Cardiac & Pulmonary Rehab    Staff Present  Renita Papa, RN BSN;Jessica Luan Pulling, MA, RCEP, CCRP, Exercise Physiologist;Joseph Flavia Shipper    Supervising physician immediately available to respond to emergencies  [facility] immediately available ER MD    Physician(s)  Dr. Jimmye Norman and Burlene Arnt    Medication changes reported      No    Fall or balance concerns reported     No    Tobacco Cessation  No Change    Warm-up and Cool-down  Performed as group-led instruction    Resistance Training Performed  Yes    VAD Patient?  No      Pain Assessment   Currently in Pain?  No/denies          Social History   Tobacco Use  Smoking Status Never Smoker  Smokeless Tobacco Never Used    Goals Met:  Proper associated with RPD/PD & O2 Sat Independence with exercise equipment Using PLB without cueing & demonstrates good technique Exercise tolerated well Strength training completed today  Goals Unmet:  Not Applicable  Comments: Pt able to follow exercise prescription today without complaint.  Will continue to monitor for progression.    Dr. Emily Filbert is Medical Director for [place] and [facility].

## 2018-01-15 ENCOUNTER — Telehealth: Payer: Self-pay | Admitting: *Deleted

## 2018-01-15 NOTE — Telephone Encounter (Signed)
Kim Owen called to let us know that she would be out today.

## 2018-01-17 ENCOUNTER — Encounter: Payer: Medicare Other | Admitting: *Deleted

## 2018-01-17 DIAGNOSIS — J449 Chronic obstructive pulmonary disease, unspecified: Secondary | ICD-10-CM

## 2018-01-17 DIAGNOSIS — J4540 Moderate persistent asthma, uncomplicated: Secondary | ICD-10-CM | POA: Diagnosis not present

## 2018-01-17 NOTE — Progress Notes (Signed)
Daily Session Note  Patient Details  Name: Kim Owen MRN: [id number] Date of Birth: [birth year] Referring Provider:     Pulmonary Rehab from 01/02/2018 in [facility] Cardiac and Pulmonary Rehab  Referring Provider  Liane Comber      Encounter Date: 01/17/2018  Check In: Session Check In - 01/17/18 1118      Check-In   Location  [facility]-Cardiac & Pulmonary Rehab    Staff Present  Nyoka Cowden, RN, BSN, MA;Marly Schuld Sherryll Burger, RN BSN;Jessica Luan Pulling, MA, RCEP, CCRP, Exercise Physiologist    Supervising physician immediately available to respond to emergencies  [facility] immediately available ER MD    Physician(s)  Dr. Quentin Cornwall and Jimmye Norman    Medication changes reported      No    Fall or balance concerns reported     No    Tobacco Cessation  No Change    Warm-up and Cool-down  Performed as group-led instruction    Resistance Training Performed  Yes    VAD Patient?  No      Pain Assessment   Currently in Pain?  No/denies          Social History   Tobacco Use  Smoking Status Never Smoker  Smokeless Tobacco Never Used    Goals Met:  Proper associated with RPD/PD & O2 Sat Independence with exercise equipment Using PLB without cueing & demonstrates good technique Exercise tolerated well No report of cardiac concerns or symptoms Strength training completed today  Goals Unmet:  Not Applicable  Comments: Pt able to follow exercise prescription today without complaint.  Will continue to monitor for progression.    Dr. Emily Filbert is Medical Director for [place] and [facility].

## 2018-01-19 ENCOUNTER — Encounter: Payer: Medicare Other | Admitting: *Deleted

## 2018-01-19 DIAGNOSIS — J449 Chronic obstructive pulmonary disease, unspecified: Secondary | ICD-10-CM

## 2018-01-19 DIAGNOSIS — J4540 Moderate persistent asthma, uncomplicated: Secondary | ICD-10-CM

## 2018-01-19 NOTE — Progress Notes (Signed)
Daily Session Note  Patient Details  Name: Kim Owen MRN: [id number] Date of Birth: Nov 18, 1934 Referring Provider:     Pulmonary Rehab from 01/02/2018 in [facility] Cardiac and Pulmonary Rehab  Referring Provider  Liane Comber      Encounter Date: 01/19/2018  Check In: Session Check In - 01/19/18 1222      Check-In   Location  [facility]-Cardiac & Pulmonary Rehab    Staff Present  Renita Papa, RN BSN;Laureen Owens Shark, BS, RRT, Respiratory Lennie Hummer, MA, RCEP, CCRP, Exercise Physiologist    Supervising physician immediately available to respond to emergencies  [facility] immediately available ER MD    Physician(s)  Dr. Joni Fears and Corky [place]    Medication changes reported      No    Fall or balance concerns reported     No    Tobacco Cessation  No Change    Warm-up and Cool-down  Performed as group-led instruction    Resistance Training Performed  Yes    VAD Patient?  No      Pain Assessment   Currently in Pain?  No/denies          Social History   Tobacco Use  Smoking Status Never Smoker  Smokeless Tobacco Never Used    Goals Met:  Proper associated with RPD/PD & O2 Sat Independence with exercise equipment Using PLB without cueing & demonstrates good technique Exercise tolerated well No report of cardiac concerns or symptoms Strength training completed today  Goals Unmet:  Not Applicable  Comments: Pt able to follow exercise prescription today without complaint.  Will continue to monitor for progression.    Dr. Emily Filbert is Medical Director for [place] and [facility].

## 2018-01-22 DIAGNOSIS — J449 Chronic obstructive pulmonary disease, unspecified: Secondary | ICD-10-CM

## 2018-01-22 DIAGNOSIS — J4540 Moderate persistent asthma, uncomplicated: Secondary | ICD-10-CM

## 2018-01-22 NOTE — Progress Notes (Signed)
Daily Session Note  Patient Details  Name: Kim Owen MRN: [id number] Date of Birth: 04/26/1935 Referring Provider:     Pulmonary Rehab from 01/02/2018 in [facility] Cardiac and Pulmonary Rehab  Referring Provider  Liane Comber      Encounter Date: 01/22/2018  Check In: Session Check In - 01/22/18 1129      Check-In   Location  [facility]-Cardiac & Pulmonary Rehab    Staff Present  Heath Lark, RN, BSN, Laveda Norman, BS, ACSM CEP, Exercise Physiologist;Pixie Burgener Oletta Darter, [place], ACSM CEP, Exercise Physiologist    Supervising physician immediately available to respond to emergencies  [facility] immediately available ER MD    Physician(s)  Clearnce Hasten and Jimmye Norman    Medication changes reported      No    Fall or balance concerns reported     No    Warm-up and Cool-down  Performed as group-led Higher education careers adviser Performed  Yes    VAD Patient?  No      Pain Assessment   Currently in Pain?  No/denies    Multiple Pain Sites  No          Social History   Tobacco Use  Smoking Status Never Smoker  Smokeless Tobacco Never Used    Goals Met:  Proper associated with RPD/PD & O2 Sat Independence with exercise equipment Exercise tolerated well Strength training completed today  Goals Unmet:  Not Applicable  Comments: Pt able to follow exercise prescription today without complaint.  Will continue to monitor for progression.    Dr. Emily Filbert is Medical Director for [place] and [facility].

## 2018-01-31 ENCOUNTER — Encounter: Payer: Medicare Other | Admitting: *Deleted

## 2018-01-31 DIAGNOSIS — J449 Chronic obstructive pulmonary disease, unspecified: Secondary | ICD-10-CM

## 2018-01-31 DIAGNOSIS — J4540 Moderate persistent asthma, uncomplicated: Secondary | ICD-10-CM

## 2018-01-31 NOTE — Progress Notes (Signed)
Daily Session Note  Patient Details  Name: Kim Owen MRN: [id number] Date of Birth: [birth year] Referring Provider:     Pulmonary Rehab from 01/02/2018 in [facility] Cardiac and Pulmonary Rehab  Referring Provider  Liane Comber      Encounter Date: 01/31/2018  Check In: Session Check In - 01/31/18 1119      Check-In   Location  [facility]-Cardiac & Pulmonary Rehab    Staff Present  Renita Papa, RN BSN;Jessica Luan Pulling, MA, RCEP, CCRP, Exercise Physiologist;Joseph Flavia Shipper    Supervising physician immediately available to respond to emergencies  [facility] immediately available ER MD    Physician(s)  Dr. Joni Fears and Quentin Cornwall    Medication changes reported      No    Fall or balance concerns reported     No    Tobacco Cessation  No Change    Warm-up and Cool-down  Performed as group-led instruction    Resistance Training Performed  Yes    VAD Patient?  No      Pain Assessment   Currently in Pain?  No/denies          Social History   Tobacco Use  Smoking Status Never Smoker  Smokeless Tobacco Never Used    Goals Met:  Proper associated with RPD/PD & O2 Sat Independence with exercise equipment Using PLB without cueing & demonstrates good technique Exercise tolerated well No report of cardiac concerns or symptoms Strength training completed today  Goals Unmet:  Not Applicable  Comments: Pt able to follow exercise prescription today without complaint.  Will continue to monitor for progression.    Dr. Emily Filbert is Medical Director for [place] and [facility].

## 2018-02-05 ENCOUNTER — Encounter: Payer: Medicare Other | Attending: Pulmonary Disease | Admitting: *Deleted

## 2018-02-05 DIAGNOSIS — J449 Chronic obstructive pulmonary disease, unspecified: Secondary | ICD-10-CM

## 2018-02-05 DIAGNOSIS — K219 Gastro-esophageal reflux disease without esophagitis: Secondary | ICD-10-CM | POA: Insufficient documentation

## 2018-02-05 DIAGNOSIS — I10 Essential (primary) hypertension: Secondary | ICD-10-CM | POA: Diagnosis not present

## 2018-02-05 DIAGNOSIS — J4540 Moderate persistent asthma, uncomplicated: Secondary | ICD-10-CM

## 2018-02-05 DIAGNOSIS — Z79899 Other long term (current) drug therapy: Secondary | ICD-10-CM | POA: Insufficient documentation

## 2018-02-05 DIAGNOSIS — I739 Peripheral vascular disease, unspecified: Secondary | ICD-10-CM | POA: Diagnosis not present

## 2018-02-05 NOTE — Progress Notes (Signed)
Pulmonary Individual Treatment Plan  Patient Details  Name: SHANZAY HEPWORTH MRN: [id number] Date of Birth: 24-Jun-1935 Referring Provider:     Pulmonary Rehab from 01/02/2018 in [facility] Cardiac and Pulmonary Rehab  Referring Provider  Liane Comber      Initial Encounter Date:    Pulmonary Rehab from 01/02/2018 in [facility] Cardiac and Pulmonary Rehab  Date  01/02/18  Referring Provider  Liane Comber      Visit Diagnosis: Chronic obstructive pulmonary disease, unspecified COPD type ([place])  Patient's Home Medications on Admission:  Current Outpatient Medications:  .  albuterol (PROVENTIL HFA;VENTOLIN HFA) 108 (90 Base) MCG/ACT inhaler, Inhale 2 puffs into the lungs every 6 (six) hours as needed for wheezing or shortness of breath., Disp: , Rfl:  .  amLODipine (NORVASC) 5 MG tablet, Take 5 mg by mouth daily., Disp: , Rfl:  .  azelastine (ASTELIN) 0.1 % nasal spray, Place 2 sprays into both nostrils 2 (two) times daily. Use in each nostril as directed, Disp: , Rfl:  .  diclofenac sodium (VOLTAREN) 1 % GEL, Apply 2 g topically 4 (four) times daily., Disp: , Rfl:  .  esomeprazole (NEXIUM) 40 MG capsule, Take 40 mg by mouth daily at 12 noon., Disp: , Rfl:  .  Fluticasone-Salmeterol (ADVAIR) 250-50 MCG/DOSE AEPB, Inhale 1 puff into the lungs 2 (two) times daily., Disp: , Rfl:  .  HYDROcodone-acetaminophen (NORCO/VICODIN) 5-325 MG tablet, 1-2 tabs po qd prn severe pain, Disp: 6 tablet, Rfl: 0 .  levothyroxine (SYNTHROID, LEVOTHROID) 25 MCG tablet, Take 25 mcg by mouth daily before breakfast., Disp: , Rfl:  .  MOMETASONE FUROATE NA, Place into the nose., Disp: , Rfl:  .  montelukast (SINGULAIR) 10 MG tablet, Take 10 mg by mouth at bedtime., Disp: , Rfl:  .  predniSONE (DELTASONE) 20 MG tablet, 2 tabs po qd for 2 days, then 1 tab po qd for 2 days, then half at tab po qd for 2 days, Disp: 7 tablet, Rfl: 0 .  SUMAtriptan (IMITREX) 50 MG tablet, Take 50 mg by mouth every 2 (two) hours as needed for migraine. May  repeat in 2 hours if headache persists or recurs., Disp: , Rfl:  .  tiotropium (SPIRIVA) 18 MCG inhalation capsule, Place 18 mcg into inhaler and inhale daily., Disp: , Rfl:  .  topiramate (TOPAMAX) 50 MG tablet, Take 50 mg by mouth 2 (two) times daily., Disp: , Rfl:   Past Medical History: Past Medical History:  Diagnosis Date  . Asthma   . Barrett esophagus   . Cataract   . Colitis   . Fatty liver   . GERD (gastroesophageal reflux disease)   . Herpes zoster   . Hypertension   . PVD (peripheral vascular disease) ([place])     Tobacco Use: Social History   Tobacco Use  Smoking Status Never Smoker  Smokeless Tobacco Never Used    Labs: Recent Review Flowsheet Data    There is no flowsheet data to display.       Pulmonary Assessment Scores: Pulmonary Assessment Scores    Row Name 01/02/18 1116         ADL UCSD   ADL Phase  Entry     SOB Score total  63     Rest  0     Walk  3     Stairs  4     Bath  2     Dress  2     Shop  2  CAT Score   CAT Score  26       mMRC Score   mMRC Score  3        Pulmonary Function Assessment: Pulmonary Function Assessment - 01/02/18 1108      Pulmonary Function Tests   FVC%  116.9 % PFT done on 01/13/17    FEV1%  109.8 %    FEV1/FVC Ratio  70.01      Breath   Bilateral Breath Sounds  Clear    Shortness of Breath  Limiting activity;Yes       Exercise Target Goals:    Exercise Program Goal: Individual exercise prescription set using results from initial 6 min walk test and THRR while considering  patient's activity barriers and safety.    Exercise Prescription Goal: Initial exercise prescription builds to 30-45 minutes a day of aerobic activity, 2-3 days per week.  Home exercise guidelines will be given to patient during program as part of exercise prescription that the participant will acknowledge.  Activity Barriers & Risk Stratification:   6 Minute Walk: 6 Minute Walk    Row Name 01/02/18 1139          6 Minute Walk   Distance  960 feet     Walk Time  6 minutes     # of Rest Breaks  0     MPH  1.82     METS  2.04     RPE  16     Perceived Dyspnea   4     VO2 Peak  7.15     Symptoms  No     Resting HR  75 bpm     Resting BP  126/52     Resting Oxygen Saturation   100 %     Exercise Oxygen Saturation  during 6 min walk  98 %     Max Ex. HR  99 bpm     Max Ex. BP  160/60     2 Minute Post BP  132/60       Interval HR   1 Minute HR  90     2 Minute HR  94     4 Minute HR  91     5 Minute HR  96     6 Minute HR  99     2 Minute Post HR  84     Interval Heart Rate?  Yes       Interval Oxygen   Interval Oxygen?  Yes     Baseline Oxygen Saturation %  100 %     1 Minute Oxygen Saturation %  100 %     1 Minute Liters of Oxygen  0 L     2 Minute Oxygen Saturation %  99 %     2 Minute Liters of Oxygen  0 L     3 Minute Liters of Oxygen  0 L     4 Minute Oxygen Saturation %  100 %     4 Minute Liters of Oxygen  0 L     5 Minute Oxygen Saturation %  100 %     5 Minute Liters of Oxygen  0 L     6 Minute Oxygen Saturation %  99 %     6 Minute Liters of Oxygen  0 L     2 Minute Post Oxygen Saturation %  98 %     2 Minute Post Liters of Oxygen  0 L  Oxygen Initial Assessment: Oxygen Initial Assessment - 01/02/18 1122      Home Oxygen   Home Oxygen Device  None    Sleep Oxygen Prescription  None    Home Exercise Oxygen Prescription  None    Home at Rest Exercise Oxygen Prescription  None      Initial 6 min Walk   Oxygen Used  None      Program Oxygen Prescription   Program Oxygen Prescription  None      Intervention   Short Term Goals  To learn and demonstrate proper use of respiratory medications;To learn and demonstrate proper pursed lip breathing techniques or other breathing techniques.;To learn and understand importance of maintaining oxygen saturations>88%;To learn and understand importance of monitoring SPO2 with pulse oximeter and demonstrate accurate use  of the pulse oximeter.    Long  Term Goals  Demonstrates proper use of MDI's;Compliance with respiratory medication;Exhibits proper breathing techniques, such as pursed lip breathing or other method taught during program session;Maintenance of O2 saturations>88%;Verbalizes importance of monitoring SPO2 with pulse oximeter and return demonstration       Oxygen Re-Evaluation: Oxygen Re-Evaluation    Row Name 01/03/18 1117 01/31/18 1218           Program Oxygen Prescription   Program Oxygen Prescription  None  None        Home Oxygen   Home Oxygen Device  None  None      Sleep Oxygen Prescription  None  None      Home Exercise Oxygen Prescription  None  None      Home at Rest Exercise Oxygen Prescription  None  None        Goals/Expected Outcomes   Short Term Goals  To learn and demonstrate proper use of respiratory medications;To learn and demonstrate proper pursed lip breathing techniques or other breathing techniques.;To learn and understand importance of maintaining oxygen saturations>88%;To learn and understand importance of monitoring SPO2 with pulse oximeter and demonstrate accurate use of the pulse oximeter.  To learn and demonstrate proper use of respiratory medications;To learn and demonstrate proper pursed lip breathing techniques or other breathing techniques.;To learn and understand importance of maintaining oxygen saturations>88%;To learn and understand importance of monitoring SPO2 with pulse oximeter and demonstrate accurate use of the pulse oximeter.      Long  Term Goals  Demonstrates proper use of MDI's;Compliance with respiratory medication;Exhibits proper breathing techniques, such as pursed lip breathing or other method taught during program session;Maintenance of O2 saturations>88%;Verbalizes importance of monitoring SPO2 with pulse oximeter and return demonstration  Demonstrates proper use of MDI's;Compliance with respiratory medication;Exhibits proper breathing  techniques, such as pursed lip breathing or other method taught during program session;Maintenance of O2 saturations>88%;Verbalizes importance of monitoring SPO2 with pulse oximeter and return demonstration      Comments  Reviewed PLB technique with pt.  Talked about how it work and it's important to maintaining his exercise saturations.    Aliz started using the Advair inhaler twice a day. She has albuterol for a rescue inhaler if she needs it. Her oxygen has been in the high 90's when exercising. She uses PLB in class but needs to use it more when she is at home.      Goals/Expected Outcomes  Short: Become more profiecient at using PLB.   Long: Become independent at using PLB.  Short: use PLB at home for shortness of breath. Long: use PLB at home independently  Oxygen Discharge (Final Oxygen Re-Evaluation): Oxygen Re-Evaluation - 01/31/18 1218      Program Oxygen Prescription   Program Oxygen Prescription  None      Home Oxygen   Home Oxygen Device  None    Sleep Oxygen Prescription  None    Home Exercise Oxygen Prescription  None    Home at Rest Exercise Oxygen Prescription  None      Goals/Expected Outcomes   Short Term Goals  To learn and demonstrate proper use of respiratory medications;To learn and demonstrate proper pursed lip breathing techniques or other breathing techniques.;To learn and understand importance of maintaining oxygen saturations>88%;To learn and understand importance of monitoring SPO2 with pulse oximeter and demonstrate accurate use of the pulse oximeter.    Long  Term Goals  Demonstrates proper use of MDI's;Compliance with respiratory medication;Exhibits proper breathing techniques, such as pursed lip breathing or other method taught during program session;Maintenance of O2 saturations>88%;Verbalizes importance of monitoring SPO2 with pulse oximeter and return demonstration    Comments  Riki started using the Advair inhaler twice a day. She has albuterol  for a rescue inhaler if she needs it. Her oxygen has been in the high 90's when exercising. She uses PLB in class but needs to use it more when she is at home.    Goals/Expected Outcomes  Short: use PLB at home for shortness of breath. Long: use PLB at home independently       Initial Exercise Prescription: Initial Exercise Prescription - 01/02/18 1100      Date of Initial Exercise RX and Referring Provider   Date  01/02/18    Referring Provider  Liane Comber      Treadmill   MPH  1.3    Grade  0.5    Minutes  15    METs  2.09      NuStep   Level  2    SPM  80    Minutes  15    METs  2      Arm Ergometer   Level  1    Watts  22    RPM  25    Minutes  15    METs  2      Biostep-RELP   Level  2    SPM  45    Minutes  15    METs  2      Prescription Details   Frequency (times per week)  3    Duration  Progress to 45 minutes of aerobic exercise without signs/symptoms of physical distress      Intensity   THRR 40-80% of Max Heartrate  101-126    Ratings of Perceived Exertion  11-13    Perceived Dyspnea  0-4      Resistance Training   Training Prescription  Yes    Weight  3 lb    Reps  10-15       Perform Capillary Blood Glucose checks as needed.  Exercise Prescription Changes: Exercise Prescription Changes    Row Name 01/05/18 1200 01/09/18 1000 01/23/18 1300         Response to Exercise   Blood Pressure (Admit)  -  148/64  130/60     Blood Pressure (Exercise)  -  132/80  -     Blood Pressure (Exit)  -  118/60  130/72     Heart Rate (Admit)  -  76 bpm  82 bpm     Heart Rate (Exercise)  -  88 bpm  90 bpm     Heart Rate (Exit)  -  64 bpm  72 bpm     Oxygen Saturation (Admit)  -  97 %  99 %     Oxygen Saturation (Exercise)  -  97 %  99 %     Oxygen Saturation (Exit)  -  99 %  98 %     Rating of Perceived Exertion (Exercise)  -  12  13     Perceived Dyspnea (Exercise)  -  0  2     Symptoms  -  none  none     Duration  -  Continue with 45 min of aerobic  exercise without signs/symptoms of physical distress.  Continue with 45 min of aerobic exercise without signs/symptoms of physical distress.     Intensity  -  THRR unchanged  THRR unchanged       Progression   Progression  -  Continue to progress workloads to maintain intensity without signs/symptoms of physical distress.  Continue to progress workloads to maintain intensity without signs/symptoms of physical distress.     Average METs  -  2.11  2.45       Resistance Training   Training Prescription  -  Yes  Yes     Weight  -  3 lbs  3 lbs     Reps  -  10-15  10-15       Interval Training   Interval Training  -  No  No       Treadmill   MPH  -  1.7  2.1     Grade  -  0.5  0.5     Minutes  -  15  15     METs  -  2.42  2.75       NuStep   Level  -  3  3     SPM  -  67  90     Minutes  -  15  15     METs  -  1.9  2.6       Biostep-RELP   Level  -  1  2     SPM  -  45  -     Minutes  -  15  15     METs  -  2  2       Home Exercise Plan   Plans to continue exercise at  Home (comment) walk with friend at [facility] (comment) walk with friend at [facility] (comment) walk with friend at [place] 3 additional days to program exercise sessions.  Add 3 additional days to program exercise sessions.  Add 3 additional days to program exercise sessions.     Initial Home Exercises Provided  01/05/18 [facility]  01/05/18 [facility]  01/05/18 [facility]        Exercise Comments: Exercise Comments    Row Name 01/03/18 1116           Exercise Comments  First full day of exercise!  Patient was oriented to gym and equipment including functions, settings, policies, and procedures.  Patient's individual exercise prescription and treatment plan were reviewed.  All starting workloads were established based on the results of the 6 minute walk test done at initial orientation visit.  The plan for exercise progression was also introduced and  progression will be customized based  on patient's performance and goals.          Exercise Goals and Review: Exercise Goals    Row Name 01/02/18 1139             Exercise Goals   Increase Physical Activity  Yes       Intervention  Provide advice, education, support and counseling about physical activity/exercise needs.;Develop an individualized exercise prescription for aerobic and resistive training based on initial evaluation findings, risk stratification, comorbidities and participant's personal goals.       Expected Outcomes  Short Term: Attend rehab on a regular basis to increase amount of physical activity.;Long Term: Add in home exercise to make exercise part of routine and to increase amount of physical activity.;Long Term: Exercising regularly at least 3-5 days a week.       Increase Strength and Stamina  Yes       Intervention  Provide advice, education, support and counseling about physical activity/exercise needs.;Develop an individualized exercise prescription for aerobic and resistive training based on initial evaluation findings, risk stratification, comorbidities and participant's personal goals.       Expected Outcomes  Short Term: Increase workloads from initial exercise prescription for resistance, speed, and METs.;Short Term: Perform resistance training exercises routinely during rehab and add in resistance training at home;Long Term: Improve cardiorespiratory fitness, muscular endurance and strength as measured by increased METs and functional capacity (6MWT)       Able to understand and use rate of perceived exertion (RPE) scale  Yes       Intervention  Provide education and explanation on how to use RPE scale       Expected Outcomes  Short Term: Able to use RPE daily in rehab to express subjective intensity level;Long Term:  Able to use RPE to guide intensity level when exercising independently       Able to understand and use Dyspnea scale  Yes       Intervention   Provide education and explanation on how to use Dyspnea scale       Expected Outcomes  Short Term: Able to use Dyspnea scale daily in rehab to express subjective sense of shortness of breath during exertion;Long Term: Able to use Dyspnea scale to guide intensity level when exercising independently       Knowledge and understanding of Target Heart Rate Range (THRR)  Yes       Intervention  Provide education and explanation of THRR including how the numbers were predicted and where they are located for reference       Expected Outcomes  Short Term: Able to state/look up THRR;Short Term: Able to use daily as guideline for intensity in rehab;Long Term: Able to use THRR to govern intensity when exercising independently       Able to check pulse independently  Yes       Intervention  Provide education and demonstration on how to check pulse in carotid and radial arteries.;Review the importance of being able to check your own pulse for safety during independent exercise       Expected Outcomes  Short Term: Able to explain why pulse checking is important during independent exercise;Long Term: Able to check pulse independently and accurately       Understanding of Exercise Prescription  Yes       Intervention  Provide education, explanation, and written materials on patient's individual exercise prescription       Expected Outcomes  Short Term: Able to explain program exercise  prescription;Long Term: Able to explain home exercise prescription to exercise independently          Exercise Goals Re-Evaluation : Exercise Goals Re-Evaluation    Row Name 01/03/18 1116 01/05/18 1233 01/09/18 1025 01/23/18 1339       Exercise Goal Re-Evaluation   Exercise Goals Review  Understanding of Exercise Prescription;Able to understand and use Dyspnea scale;Knowledge and understanding of Target Heart Rate Range (THRR);Able to understand and use rate of perceived exertion (RPE) scale  Increase Physical Activity;Able to  understand and use Dyspnea scale;Understanding of Exercise Prescription;Increase Strength and Stamina;Knowledge and understanding of Target Heart Rate Range (THRR);Able to understand and use rate of perceived exertion (RPE) scale;Able to check pulse independently  Increase Physical Activity;Understanding of Exercise Prescription;Increase Strength and Stamina  Increase Physical Activity;Understanding of Exercise Prescription;Increase Strength and Stamina    Comments  Reviewed RPE scale, THR and program prescription with pt today.  Pt voiced understanding and was given a copy of goals to take home.   Reviewed home exercise with pt today.  Pt plans to walk at [facility] with friend for exercise.  Reviewed THR, pulse, RPE, sign and symptoms, NTG use, and when to call 911 or MD.  Also discussed weather considerations and indoor options.  Pt voiced understanding.  Melonie has been doing well since returning to rehab.  She has completed three full days of exercise.  She is already up to level 3 on the NuStep.  We will continue to monitor her progression.   Ilia continues to do well in rehab.  She is now up to 2.1 mph on the treadmill and level 2 on the BioStep!   We will continue to monitor her progression.     Expected Outcomes  Short: Use RPE daily to regulate intensity.  Long: Follow program prescription in THR.  Short: Start with 3 days walking with friend.  Long: Continue to exercise independently  Short: Start with 3 days walking with friend.  Long: Continue to exercise independently   Short: Increase workload on NuStep to level 4.  Long: Continue to add in more exercise at home.        Discharge Exercise Prescription (Final Exercise Prescription Changes): Exercise Prescription Changes - 01/23/18 1300      Response to Exercise   Blood Pressure (Admit)  130/60    Blood Pressure (Exit)  130/72    Heart Rate (Admit)  82 bpm    Heart Rate (Exercise)  90 bpm    Heart Rate (Exit)  72 bpm     Oxygen Saturation (Admit)  99 %    Oxygen Saturation (Exercise)  99 %    Oxygen Saturation (Exit)  98 %    Rating of Perceived Exertion (Exercise)  13    Perceived Dyspnea (Exercise)  2    Symptoms  none    Duration  Continue with 45 min of aerobic exercise without signs/symptoms of physical distress.    Intensity  THRR unchanged      Progression   Progression  Continue to progress workloads to maintain intensity without signs/symptoms of physical distress.    Average METs  2.45      Resistance Training   Training Prescription  Yes    Weight  3 lbs    Reps  10-15      Interval Training   Interval Training  No      Treadmill   MPH  2.1    Grade  0.5    Minutes  15    METs  2.75      NuStep   Level  3    SPM  90    Minutes  15    METs  2.6      Biostep-RELP   Level  2    Minutes  15    METs  2      Home Exercise Plan   Plans to continue exercise at  Home (comment) walk with friend at [facility]    Frequency  Add 3 additional days to program exercise sessions.    Initial Home Exercises Provided  01/05/18 [facility]       Nutrition:  Target Goals: Understanding of nutrition guidelines, daily intake of sodium '1500mg'$ , cholesterol '200mg'$ , calories 30% from fat and 7% or less from saturated fats, daily to have 5 or more servings of fruits and vegetables.  Biometrics: Pre Biometrics - 01/02/18 1138      Pre Biometrics   Height  '5\' 6"'$  (1.676 m)    Weight  153 lb 9.6 oz (69.7 kg)    Waist Circumference  36.25 inches    Hip Circumference  39 inches    Waist to Hip Ratio  0.93 %    BMI (Calculated)  24.8        Nutrition Therapy Plan and Nutrition Goals: Nutrition Therapy & Goals - 01/02/18 1115      Personal Nutrition Goals   Nutrition Goal  Learn to eat better and lose some weight.    Comments  She eats lot of vegetables and wants to learn to eat better.      Intervention Plan   Intervention  Prescribe, educate and counsel regarding individualized  specific dietary modifications aiming towards targeted core components such as weight, hypertension, lipid management, diabetes, heart failure and other comorbidities.    Expected Outcomes  Short Term Goal: Understand basic principles of dietary content, such as calories, fat, sodium, cholesterol and nutrients.;Long Term Goal: Adherence to prescribed nutrition plan.       Nutrition Assessments: Nutrition Assessments - 01/02/18 1115      MEDFICTS Scores   Pre Score  27       Nutrition Goals Re-Evaluation: Nutrition Goals Re-Evaluation    [place] Name 01/31/18 1223             Goals   Current Weight  148 lb (67.1 kg)       Nutrition Goal  Eat healthier and maintain weight       Comment  Brisa lipids are high and needs to change her diet to help. She tries to eat better and has met with the dietician. She may need to take medication for her cholesterol but she is unsure if she wants to do so.       Expected Outcome  Short: eat more vegatable and watch foods high in cholesterol. Long: get Lipid levels lower by dieting and exercising          Nutrition Goals Discharge (Final Nutrition Goals Re-Evaluation): Nutrition Goals Re-Evaluation - 01/31/18 1223      Goals   Current Weight  148 lb (67.1 kg)    Nutrition Goal  Eat healthier and maintain weight    Comment  Shawndell lipids are high and needs to change her diet to help. She tries to eat better and has met with the dietician. She may need to take medication for her cholesterol but she is unsure if she wants to do so.    Expected  Outcome  Short: eat more vegatable and watch foods high in cholesterol. Long: get Lipid levels lower by dieting and exercising       Psychosocial: Target Goals: Acknowledge presence or absence of significant depression and/or stress, maximize coping skills, provide positive support system. Participant is able to verbalize types and ability to use techniques and skills needed for reducing stress and  depression.   Initial Review & Psychosocial Screening: Initial Psych Review & Screening - 01/02/18 1115      Initial Review   Current issues with  Current Depression;Current Sleep Concerns;Current Stress Concerns    Source of Stress Concerns  Chronic Illness;Family;Unable to perform yard/household activities;Unable to participate in former interests or hobbies    Comments  Her family is still having some issues but is working through it      Lynd?  Yes    Comments  Her son, daughter in law and daughter.      Barriers   Psychosocial barriers to participate in program  The patient should benefit from training in stress management and relaxation.      Screening Interventions   Interventions  To provide support and resources with identified psychosocial needs;Program counselor consult;Encouraged to exercise;Provide feedback about the scores to participant    Expected Outcomes  Long Term goal: The participant improves quality of Life and PHQ9 Scores as seen by post scores and/or verbalization of changes;Short Term goal: Identification and review with participant of any Quality of Life or Depression concerns found by scoring the questionnaire.;Long Term Goal: Stressors or current issues are controlled or eliminated.;Short Term goal: Utilizing psychosocial counselor, staff and physician to assist with identification of specific Stressors or current issues interfering with healing process. Setting desired goal for each stressor or current issue identified.       Quality of Life Scores:  Scores of 19 and below usually indicate a poorer quality of life in these areas.  A difference of  2-3 points is a clinically meaningful difference.  A difference of 2-3 points in the total score of the Quality of Life Index has been associated with significant improvement in overall quality of life, self-image, physical symptoms, and general health in studies assessing change in  quality of life.  PHQ-9: Recent Review Flowsheet Data    Depression screen [facility] 2/9 01/02/2018 09/18/2017   Decreased Interest 2 3   Down, Depressed, Hopeless 1 2   PHQ - 2 Score 3 5   Altered sleeping 0 2   Tired, decreased energy 1 3   Change in appetite 0 0   Feeling bad or failure about yourself  1 1   Trouble concentrating 1 1   Moving slowly or fidgety/restless 0 1   Suicidal thoughts 0 0   PHQ-9 Score 6 13   Difficult doing work/chores Somewhat difficult Somewhat difficult     Interpretation of Total Score  Total Score Depression Severity:  1-4 = Minimal depression, 5-9 = Mild depression, 10-14 = Moderate depression, 15-19 = Moderately severe depression, 20-27 = Severe depression   Psychosocial Evaluation and Intervention: Psychosocial Evaluation - 01/31/18 1231      Psychosocial Evaluation & Interventions   Interventions  Encouraged to exercise with the program and follow exercise prescription;Stress management education;Relaxation education    Comments  Counselor met with Ms. Grosch today Silva Bandy) for initial psychosocial evaluation.  She is an 83 year old who lives in an in-law apartment connected to the home of her son and  daughter-in-law.  She also has a strong support system with friends in her church community.  Silva Bandy struggles with several health issues including Asthma; HBP, and high cholesterol.  She sleeps well; has a good appetite; and denies a history of depression or anxiety.  However, Silva Bandy is struggling with current anxiety symptoms due to some family issues.  Counselor provided support and encouraged Silva Bandy to speak with her pastor again.  She will be having her annual physical in a few weeks and counselor encouraged her to speak with the Dr. about this stress if it has not improved at that time.  She exercises; talks to others; and her faith helps her through this tough time.  She has goals to breathe better; and to be "better/stronger" while in this program.   Staff will follow with Silva Bandy.    Expected Outcomes  Short:  Silva Bandy will find support/resources to help manage her current stress better.    Long:  Silva Bandy will exercise for stress reduction and her overall health.    Continue Psychosocial Services   Follow up required by staff       Psychosocial Re-Evaluation:   Psychosocial Discharge (Final Psychosocial Re-Evaluation):   Education: Education Goals: Education classes will be provided on a weekly basis, covering required topics. Participant will state understanding/return demonstration of topics presented.  Learning Barriers/Preferences: Learning Barriers/Preferences - 01/02/18 1107      Learning Barriers/Preferences   Learning Barriers  Sight Wears reading glasses    Learning Preferences  None       Education Topics:  Initial Evaluation Education: - Verbal, written and demonstration of respiratory meds, oximetry and breathing techniques. Instruction on use of nebulizers and MDIs and importance of monitoring MDI activations.   Pulmonary Rehab from 01/31/2018 in [facility] Cardiac and Pulmonary Rehab  Date  01/02/18  Educator  [facility]  Instruction Review Code  1- Verbalizes Understanding      General Nutrition Guidelines/Fats and Fiber: -Group instruction provided by verbal, written material, models and posters to present the general guidelines for heart healthy nutrition. Gives an explanation and review of dietary fats and fiber.   Pulmonary Rehab from 01/31/2018 in [facility] Cardiac and Pulmonary Rehab  Date  01/22/18  Educator  [facility]  Instruction Review Code  1- Verbalizes Understanding      Controlling Sodium/Reading Food Labels: -Group verbal and written material supporting the discussion of sodium use in heart healthy nutrition. Review and explanation with models, verbal and written materials for utilization of the food label.   Exercise Physiology & General Exercise Guidelines: - Group verbal and written instruction with models  to review the exercise physiology of the cardiovascular system and associated critical values. Provides general exercise guidelines with specific guidelines to those with heart or lung disease.    Aerobic Exercise & Resistance Training: - Gives group verbal and written instruction on the various components of exercise. Focuses on aerobic and resistive training programs and the benefits of this training and how to safely progress through these programs.   Flexibility, Balance, Mind/Body Relaxation: Provides group verbal/written instruction on the benefits of flexibility and balance training, including mind/body exercise modes such as yoga, pilates and tai chi.  Demonstration and skill practice provided.   Pulmonary Rehab from 01/31/2018 in [facility] Cardiac and Pulmonary Rehab  Date  01/10/18  Educator  [facility]  Instruction Review Code  1- Verbalizes Understanding      Stress and Anxiety: - Provides group verbal and written instruction about the health risks of elevated stress  and causes of high stress.  Discuss the correlation between heart/lung disease and anxiety and treatment options. Review healthy ways to manage with stress and anxiety.   Pulmonary Rehab from 01/31/2018 in [facility] Cardiac and Pulmonary Rehab  Date  01/31/18  Educator  [facility]  Instruction Review Code  1- Verbalizes Understanding      Depression: - Provides group verbal and written instruction on the correlation between heart/lung disease and depressed mood, treatment options, and the stigmas associated with seeking treatment.   Pulmonary Rehab from 01/31/2018 in [facility] Cardiac and Pulmonary Rehab  Date  01/17/18  Educator  [facility]  Instruction Review Code  1- Verbalizes Understanding      Exercise & Equipment Safety: - Individual verbal instruction and demonstration of equipment use and safety with use of the equipment.   Pulmonary Rehab from 01/31/2018 in [facility] Cardiac and Pulmonary Rehab  Date  01/02/18  Educator  [facility]  Instruction  Review Code  1- Verbalizes Understanding      Infection Prevention: - Provides verbal and written material to individual with discussion of infection control including proper hand washing and proper equipment cleaning during exercise session.   Pulmonary Rehab from 01/31/2018 in [facility] Cardiac and Pulmonary Rehab  Date  01/02/18  Educator  [facility]  Instruction Review Code  1- Verbalizes Understanding      Falls Prevention: - Provides verbal and written material to individual with discussion of falls prevention and safety.   Pulmonary Rehab from 01/31/2018 in [facility] Cardiac and Pulmonary Rehab  Date  01/02/18  Educator  [facility]  Instruction Review Code  1- Verbalizes Understanding      Diabetes: - Individual verbal and written instruction to review signs/symptoms of diabetes, desired ranges of glucose level fasting, after meals and with exercise. Advice that pre and post exercise glucose checks will be done for 3 sessions at entry of program.   Chronic Lung Diseases: - Group verbal and written instruction to review updates, respiratory medications, advancements in procedures and treatments. Discuss use of supplemental oxygen including available portable oxygen systems, continuous and intermittent flow rates, concentrators, personal use and safety guidelines. Review proper use of inhaler and spacers. Provide informative websites for self-education.    Energy Conservation: - Provide group verbal and written instruction for methods to conserve energy, plan and organize activities. Instruct on pacing techniques, use of adaptive equipment and posture/positioning to relieve shortness of breath.   Triggers and Exacerbations: - Group verbal and written instruction to review types of environmental triggers and ways to prevent exacerbations. Discuss weather changes, air quality and the benefits of nasal washing. Review warning signs and symptoms to help prevent infections. Discuss techniques for effective  airway clearance, coughing, and vibrations.   AED/CPR: - Group verbal and written instruction with the use of models to demonstrate the basic use of the AED with the basic ABC's of resuscitation.   Anatomy and Physiology of the Lungs: - Group verbal and written instruction with the use of models to provide basic lung anatomy and physiology related to function, structure and complications of lung disease.   Anatomy & Physiology of the Heart: - Group verbal and written instruction and models provide basic cardiac anatomy and physiology, with the coronary electrical and arterial systems. Review of Valvular disease and Heart Failure   Pulmonary Rehab from 01/31/2018 in [facility] Cardiac and Pulmonary Rehab  Date  01/03/18  Educator  [facility]  Instruction Review Code  1- Verbalizes Understanding      Cardiac Medications: - Group  verbal and written instruction to review commonly prescribed medications for heart disease. Reviews the medication, class of the drug, and side effects.   Know Your Numbers and Risk Factors: -Group verbal and written instruction about important numbers in your health.  Discussion of what are risk factors and how they play a role in the disease process.  Review of Cholesterol, Blood Pressure, Diabetes, and BMI and the role they play in your overall health.   Sleep Hygiene: -Provides group verbal and written instruction about how sleep can affect your health.  Define sleep hygiene, discuss sleep cycles and impact of sleep habits. Review good sleep hygiene tips.    Other: -Provides group and verbal instruction on various topics (see comments)   Pulmonary Rehab from 10/02/2017 in [facility] Cardiac and Pulmonary Rehab  Date  09/27/17  Educator  [facility]  Instruction Review Code  1- Verbalizes Understanding       Knowledge Questionnaire Score: Knowledge Questionnaire Score - 01/02/18 1119      Knowledge Questionnaire Score   Pre Score  13/18 reviewed with patient        Core  Components/Risk Factors/Patient Goals at Admission: Personal Goals and Risk Factors at Admission - 01/02/18 1123      Core Components/Risk Factors/Patient Goals on Admission    Weight Management  Yes;Weight Loss    Intervention  Weight Management: Develop a combined nutrition and exercise program designed to reach desired caloric intake, while maintaining appropriate intake of nutrient and fiber, sodium and fats, and appropriate energy expenditure required for the weight goal.;Weight Management: Provide education and appropriate resources to help participant work on and attain dietary goals.;Weight Management/Obesity: Establish reasonable short term and long term weight goals.    Admit Weight  153 lb 9.6 oz (69.7 kg)    Goal Weight: Short Term  148 lb (67.1 kg)    Goal Weight: Long Term  148 lb (67.1 kg)    Expected Outcomes  Short Term: Continue to assess and modify interventions until short term weight is achieved;Long Term: Adherence to nutrition and physical activity/exercise program aimed toward attainment of established weight goal;Weight Loss: Understanding of general recommendations for a balanced deficit meal plan, which promotes 1-2 lb weight loss per week and includes a negative energy balance of [phone] kcal/d;Understanding recommendations for meals to include 15-35% energy as protein, 25-35% energy from fat, 35-60% energy from carbohydrates, less than '200mg'$  of dietary cholesterol, 20-35 gm of total fiber daily;Understanding of distribution of calorie intake throughout the day with the consumption of 4-5 meals/snacks;Weight Maintenance: Understanding of the daily nutrition guidelines, which includes 25-35% calories from fat, 7% or less cal from saturated fats, less than '200mg'$  cholesterol, less than 1.5gm of sodium, & 5 or more servings of fruits and vegetables daily    Improve shortness of breath with ADL's  Yes    Intervention  Provide education, individualized exercise plan and daily  activity instruction to help decrease symptoms of SOB with activities of daily living.    Expected Outcomes  Short Term: Improve cardiorespiratory fitness to achieve a reduction of symptoms when performing ADLs;Long Term: Be able to perform more ADLs without symptoms or delay the onset of symptoms    Hypertension  Yes    Intervention  Provide education on lifestyle modifcations including regular physical activity/exercise, weight management, moderate sodium restriction and increased consumption of fresh fruit, vegetables, and low fat dairy, alcohol moderation, and smoking cessation.;Monitor prescription use compliance.    Expected Outcomes  Short Term: Continued assessment and  intervention until BP is < 140/2m HG in hypertensive participants. < 130/883mHG in hypertensive participants with diabetes, heart failure or chronic kidney disease.;Long Term: Maintenance of blood pressure at goal levels.    Lipids  Yes    Intervention  Provide education and support for participant on nutrition & aerobic/resistive exercise along with prescribed medications to achieve LDL '70mg'$ , HDL >'40mg'$ .    Expected Outcomes  Short Term: Participant states understanding of desired cholesterol values and is compliant with medications prescribed. Participant is following exercise prescription and nutrition guidelines.;Long Term: Cholesterol controlled with medications as prescribed, with individualized exercise RX and with personalized nutrition plan. Value goals: LDL < '70mg'$ , HDL > 40 mg.       Core Components/Risk Factors/Patient Goals Review:  Goals and Risk Factor Review    Row Name 01/31/18 1211             Core Components/Risk Factors/Patient Goals Review   Personal Goals Review  Weight Management/Obesity;Improve shortness of breath with ADL's;Hypertension;Lipids       Review  Yamaris has come down in her weight since she started the program. She takes medication for her blood pressure and it has been stable. Her  last reading was 124/58. She states her breathing is not that great at home. Her breathing sometime feels short at home but she does not check her oxygen. She feels like her oxygen is fine but sometimes she feels more short of breath at home then in class. She has had her cholesterol checked recently and her cholesterol was high. Her doctor states that she may want to put her on cholesterol medication but is not sure yet.       Expected Outcomes  Short: change diet to help with cholesterol. Long: take medication for Lipids if need be and get lipids lower.          Core Components/Risk Factors/Patient Goals at Discharge (Final Review):  Goals and Risk Factor Review - 01/31/18 1211      Core Components/Risk Factors/Patient Goals Review   Personal Goals Review  Weight Management/Obesity;Improve shortness of breath with ADL's;Hypertension;Lipids    Review  Karalina has come down in her weight since she started the program. She takes medication for her blood pressure and it has been stable. Her last reading was 124/58. She states her breathing is not that great at home. Her breathing sometime feels short at home but she does not check her oxygen. She feels like her oxygen is fine but sometimes she feels more short of breath at home then in class. She has had her cholesterol checked recently and her cholesterol was high. Her doctor states that she may want to put her on cholesterol medication but is not sure yet.    Expected Outcomes  Short: change diet to help with cholesterol. Long: take medication for Lipids if need be and get lipids lower.       ITP Comments: ITP Comments    Row Name 01/02/18 1105 01/08/18 0841 02/05/18 0831       ITP Comments  Medical Evaluation completed. Chart sent for review and changes to Dr. MaEmily Filbertirector of [facility]osis can be found in [facility] encounter 12/15/17   30 day review completed. ITP sent to Dr. MaEmily Filbertirector of Lu[place] CreekContinue with ITP unless  changes are made by physician   30 day review completed. ITP sent to Dr. MaEmily Filbertirector of LuElbeContinue with ITP unless changes are made by physician  Comments: 30 day review

## 2018-02-05 NOTE — Progress Notes (Signed)
Daily Session Note  Patient Details  Name: Kim Owen MRN: [id number] Date of Birth: [birth year] Referring Provider:     Pulmonary Rehab from 01/02/2018 in [facility] Cardiac and Pulmonary Rehab  Referring Provider  Liane Comber      Encounter Date: 02/05/2018  Check In: Session Check In - 02/05/18 1132      Check-In   Location  [facility]-Cardiac & Pulmonary Rehab    Staff Present  Justin Mend RCP,RRT,BSRT;Laureen Owens Shark, BS, RRT, Respiratory Bertis Ruddy, BS, ACSM CEP, Exercise Physiologist    Supervising physician immediately available to respond to emergencies  [facility] immediately available ER MD    Physician(s)  Drs. Malinda and Kinner    Medication changes reported      No    Fall or balance concerns reported     No    Tobacco Cessation  No Change    Warm-up and Cool-down  Performed as group-led Higher education careers adviser Performed  Yes    VAD Patient?  No      Pain Assessment   Currently in Pain?  No/denies    Multiple Pain Sites  No          Social History   Tobacco Use  Smoking Status Never Smoker  Smokeless Tobacco Never Used    Goals Met:  Proper associated with RPD/PD & O2 Sat Independence with exercise equipment Exercise tolerated well No report of cardiac concerns or symptoms Strength training completed today  Goals Unmet:  Not Applicable  Comments: Pt able to follow exercise prescription today without complaint.  Will continue to monitor for progression.    Dr. Emily Filbert is Medical Director for [place] and [facility].

## 2018-02-09 ENCOUNTER — Telehealth: Payer: Self-pay

## 2018-02-09 NOTE — Telephone Encounter (Signed)
Kim Owen is in a boot due to a wound on her leg.  She sees her Dr Tuesday and hopes she will be out of the boot and can return to class Wednesday.

## 2018-02-14 ENCOUNTER — Encounter: Payer: Medicare Other | Admitting: *Deleted

## 2018-02-14 DIAGNOSIS — J4540 Moderate persistent asthma, uncomplicated: Secondary | ICD-10-CM | POA: Diagnosis not present

## 2018-02-14 DIAGNOSIS — J449 Chronic obstructive pulmonary disease, unspecified: Secondary | ICD-10-CM

## 2018-02-14 NOTE — Progress Notes (Signed)
Daily Session Note  Patient Details  Name: Kim Owen MRN: [id number] Date of Birth: 16-Aug-1935 Referring Provider:     Pulmonary Rehab from 01/02/2018 in [facility] Cardiac and Pulmonary Rehab  Referring Provider  Liane Comber      Encounter Date: 02/14/2018  Check In: Session Check In - 02/14/18 1137      Check-In   Location  [facility]-Cardiac & Pulmonary Rehab    Staff Present  Justin Mend Lorre Nick, [place], RCEP, CCRP, Exercise Physiologist;Sondra Blixt Sherryll Burger, RN BSN    Supervising physician immediately available to respond to emergencies  [facility] immediately available ER MD    Physician(s)  Dr. Kerman Passey and Joni Fears    Medication changes reported      No    Fall or balance concerns reported     No    Tobacco Cessation  No Change    Warm-up and Cool-down  Performed as group-led instruction    Resistance Training Performed  Yes    VAD Patient?  No      Pain Assessment   Currently in Pain?  No/denies          Social History   Tobacco Use  Smoking Status Never Smoker  Smokeless Tobacco Never Used    Goals Met:  Proper associated with RPD/PD & O2 Sat Independence with exercise equipment Using PLB without cueing & demonstrates good technique Exercise tolerated well No report of cardiac concerns or symptoms Strength training completed today  Goals Unmet:  Not Applicable  Comments: Pt able to follow exercise prescription today without complaint.  Will continue to monitor for progression.    Dr. Emily Filbert is Medical Director for [place] and [facility].

## 2018-02-16 DIAGNOSIS — J4540 Moderate persistent asthma, uncomplicated: Secondary | ICD-10-CM | POA: Diagnosis not present

## 2018-02-16 DIAGNOSIS — J449 Chronic obstructive pulmonary disease, unspecified: Secondary | ICD-10-CM

## 2018-02-16 NOTE — Progress Notes (Signed)
Daily Session Note  Patient Details  Name: Kim Owen MRN: [id number] Date of Birth: 12-01-1934 Referring Provider:     Pulmonary Rehab from 01/02/2018 in [facility] Cardiac and Pulmonary Rehab  Referring Provider  Liane Comber      Encounter Date: 02/16/2018  Check In: Session Check In - 02/16/18 1137      Check-In   Location  [facility]-Cardiac & Pulmonary Rehab    Staff Present  Renita Papa, RN BSN;Yashas Camilli Darrin Nipper, [place], RCEP, [place], Exercise Physiologist    Supervising physician immediately available to respond to emergencies  [facility] immediately available ER MD    Physician(s)  Dr. Corky Downs and Joni Fears    Medication changes reported      No    Fall or balance concerns reported     No    Tobacco Cessation  No Change    Warm-up and Cool-down  Performed as group-led instruction    Resistance Training Performed  Yes    VAD Patient?  No      Pain Assessment   Currently in Pain?  No/denies          Social History   Tobacco Use  Smoking Status Never Smoker  Smokeless Tobacco Never Used    Goals Met:  Proper associated with RPD/PD & O2 Sat Independence with exercise equipment Using PLB without cueing & demonstrates good technique Exercise tolerated well No report of cardiac concerns or symptoms Strength training completed today  Goals Unmet:  Not Applicable  Comments: Pt able to follow exercise prescription today without complaint.  Will continue to monitor for progression.    Dr. Emily Filbert is Medical Director for [place] and [facility].

## 2018-02-19 DIAGNOSIS — J449 Chronic obstructive pulmonary disease, unspecified: Secondary | ICD-10-CM

## 2018-02-19 DIAGNOSIS — J4540 Moderate persistent asthma, uncomplicated: Secondary | ICD-10-CM | POA: Diagnosis not present

## 2018-02-19 NOTE — Progress Notes (Signed)
Daily Session Note  Patient Details  Name: Kim Owen MRN: [id number] Date of Birth: Jan 07, 1935 Referring Provider:     Pulmonary Rehab from 01/02/2018 in [facility] Cardiac and Pulmonary Rehab  Referring Provider  Liane Comber      Encounter Date: 02/19/2018  Check In: Session Check In - 02/19/18 1157      Check-In   Location  [facility]-Cardiac & Pulmonary Rehab    Staff Present  Justin Mend RCP,RRT,BSRT;Amanda Oletta Darter, BA, ACSM CEP, Exercise Physiologist;Kelly Amedeo Plenty, BS, ACSM CEP, Exercise Physiologist    Supervising physician immediately available to respond to emergencies  [facility] immediately available ER MD    Physician(s)  Dr. Corky Downs and Clearnce Hasten    Medication changes reported      No    Fall or balance concerns reported     No    Tobacco Cessation  No Change    Warm-up and Cool-down  Performed as group-led instruction    Resistance Training Performed  Yes    VAD Patient?  No      Pain Assessment   Currently in Pain?  No/denies          Social History   Tobacco Use  Smoking Status Never Smoker  Smokeless Tobacco Never Used    Goals Met:  Independence with exercise equipment Exercise tolerated well No report of cardiac concerns or symptoms Strength training completed today  Goals Unmet:  Not Applicable  Comments: Pt able to follow exercise prescription today without complaint.  Will continue to monitor for progression.   Dr. Emily Filbert is Medical Director for [place] and [facility].

## 2018-02-21 ENCOUNTER — Encounter: Payer: Medicare Other | Admitting: *Deleted

## 2018-02-21 DIAGNOSIS — J4540 Moderate persistent asthma, uncomplicated: Secondary | ICD-10-CM | POA: Diagnosis not present

## 2018-02-21 DIAGNOSIS — J449 Chronic obstructive pulmonary disease, unspecified: Secondary | ICD-10-CM

## 2018-02-21 NOTE — Progress Notes (Signed)
Daily Session Note  Patient Details  Name: EMERIE VANDERKOLK MRN: [id number] Date of Birth: 03/08/1935 Referring Provider:     Pulmonary Rehab from 01/02/2018 in [facility] Cardiac and Pulmonary Rehab  Referring Provider  Liane Comber      Encounter Date: 02/21/2018  Check In: Session Check In - 02/21/18 1145      Check-In   Location  [facility]-Cardiac & Pulmonary Rehab    Staff Present  Justin Mend Lorre Nick, [place], RCEP, CCRP, Exercise Physiologist;Devarion Mcclanahan Sherryll Burger, RN BSN    Supervising physician immediately available to respond to emergencies  [facility] immediately available ER MD    Physician(s)  Dr. Jimmye Norman and Corky Downs    Medication changes reported      No    Fall or balance concerns reported     No    Tobacco Cessation  No Change    Warm-up and Cool-down  Performed as group-led instruction    Resistance Training Performed  Yes    VAD Patient?  No      Pain Assessment   Currently in Pain?  No/denies        Exercise Prescription Changes - 02/20/18 1400      Response to Exercise   Blood Pressure (Admit)  140/82    Blood Pressure (Exit)  122/58    Heart Rate (Admit)  80 bpm    Heart Rate (Exercise)  96 bpm    Heart Rate (Exit)  84 bpm    Oxygen Saturation (Admit)  97 %    Oxygen Saturation (Exercise)  89 %    Oxygen Saturation (Exit)  95 %    Rating of Perceived Exertion (Exercise)  15    Perceived Dyspnea (Exercise)  2    Symptoms  none    Duration  Continue with 45 min of aerobic exercise without signs/symptoms of physical distress.    Intensity  THRR unchanged      Progression   Progression  Continue to progress workloads to maintain intensity without signs/symptoms of physical distress.    Average METs  3.09      Resistance Training   Training Prescription  Yes    Weight  4 lbs    Reps  10-15      Interval Training   Interval Training  No      Treadmill   MPH  2.2    Grade  0    Minutes  15    METs  2.68      NuStep   Level  4    SPM  75    Minutes  15    METs  2.6      Biostep-RELP   Level  5    SPM  43    Minutes  15    METs  4      Home Exercise Plan   Plans to continue exercise at  Home (comment) walk with friend at [facility]    Frequency  Add 3 additional days to program exercise sessions.    Initial Home Exercises Provided  01/05/18 [facility]       Social History   Tobacco Use  Smoking Status Never Smoker  Smokeless Tobacco Never Used    Goals Met:  Proper associated with RPD/PD & O2 Sat Independence with exercise equipment Using PLB without cueing & demonstrates good technique Exercise tolerated well No report of cardiac concerns or symptoms Strength training completed today  Goals Unmet:  Not Applicable  Comments: Pt able to follow  exercise prescription today without complaint.  Will continue to monitor for progression.    Dr. Emily Filbert is Medical Director for [place] and [facility].

## 2018-02-26 DIAGNOSIS — J4540 Moderate persistent asthma, uncomplicated: Secondary | ICD-10-CM | POA: Diagnosis not present

## 2018-02-26 DIAGNOSIS — J449 Chronic obstructive pulmonary disease, unspecified: Secondary | ICD-10-CM

## 2018-02-26 NOTE — Progress Notes (Signed)
Daily Session Note  Patient Details  Name: FATIME BISWELL MRN: [id number] Date of Birth: 1935/02/28 Referring Provider:     Pulmonary Rehab from 01/02/2018 in [facility] Cardiac and Pulmonary Rehab  Referring Provider  Liane Comber      Encounter Date: 02/26/2018  Check In: Session Check In - 02/26/18 1146      Check-In   Location  [facility]-Cardiac & Pulmonary Rehab    Staff Present  Justin Mend RCP,RRT,BSRT;Amanda Oletta Darter, BA, ACSM CEP, Exercise Physiologist;Kelly Amedeo Plenty, BS, ACSM CEP, Exercise Physiologist    Supervising physician immediately available to respond to emergencies  [facility] immediately available ER MD    Physician(s)  Dr. Jimmye Norman and Cinda Quest    Medication changes reported      No    Fall or balance concerns reported     No    Tobacco Cessation  No Change    Warm-up and Cool-down  Performed as group-led instruction    Resistance Training Performed  Yes    VAD Patient?  No      Pain Assessment   Currently in Pain?  No/denies          Social History   Tobacco Use  Smoking Status Never Smoker  Smokeless Tobacco Never Used    Goals Met:  Independence with exercise equipment Exercise tolerated well No report of cardiac concerns or symptoms Strength training completed today  Goals Unmet:  Not Applicable  Comments: Pt able to follow exercise prescription today without complaint.  Will continue to monitor for progression.   Dr. Emily Filbert is Medical Director for [place] and [facility].

## 2018-02-28 ENCOUNTER — Encounter: Payer: Medicare Other | Admitting: *Deleted

## 2018-02-28 DIAGNOSIS — J449 Chronic obstructive pulmonary disease, unspecified: Secondary | ICD-10-CM

## 2018-02-28 DIAGNOSIS — J4540 Moderate persistent asthma, uncomplicated: Secondary | ICD-10-CM | POA: Diagnosis not present

## 2018-02-28 NOTE — Progress Notes (Signed)
Daily Session Note  Patient Details  Name: Kim Owen MRN: [id number] Date of Birth: 1935-05-24 Referring Provider:     Pulmonary Rehab from 01/02/2018 in [facility] Cardiac and Pulmonary Rehab  Referring Provider  Liane Comber      Encounter Date: 02/28/2018  Check In: Session Check In - 02/28/18 1123      Check-In   Location  [facility]-Cardiac & Pulmonary Rehab    Staff Present  Renita Papa, RN BSN;Jessica Luan [place], [place], RCEP, CCRP, Exercise Physiologist;Joseph Flavia Shipper    Supervising physician immediately available to respond to emergencies  [facility] immediately available ER MD    Physician(s)  Dr. Jimmye Norman and Quentin Cornwall    Medication changes reported      No    Fall or balance concerns reported     No    Tobacco Cessation  No Change    Warm-up and Cool-down  Performed as group-led instruction    Resistance Training Performed  Yes    VAD Patient?  No    PAD/SET Patient?  No      Pain Assessment   Currently in Pain?  No/denies          Social History   Tobacco Use  Smoking Status Never Smoker  Smokeless Tobacco Never Used    Goals Met:  Proper associated with RPD/PD & O2 Sat Independence with exercise equipment Using PLB without cueing & demonstrates good technique Exercise tolerated well Personal goals reviewed No report of cardiac concerns or symptoms Strength training completed today  Goals Unmet:  Not Applicable  Comments: Pt able to follow exercise prescription today without complaint.  Will continue to monitor for progression.    Dr. Emily Filbert is Medical Director for [place] and [facility].

## 2018-03-05 ENCOUNTER — Encounter: Payer: Medicare Other | Attending: Pulmonary Disease

## 2018-03-05 DIAGNOSIS — J4540 Moderate persistent asthma, uncomplicated: Secondary | ICD-10-CM | POA: Insufficient documentation

## 2018-03-05 DIAGNOSIS — I739 Peripheral vascular disease, unspecified: Secondary | ICD-10-CM | POA: Diagnosis not present

## 2018-03-05 DIAGNOSIS — J449 Chronic obstructive pulmonary disease, unspecified: Secondary | ICD-10-CM

## 2018-03-05 DIAGNOSIS — Z79899 Other long term (current) drug therapy: Secondary | ICD-10-CM | POA: Insufficient documentation

## 2018-03-05 DIAGNOSIS — K219 Gastro-esophageal reflux disease without esophagitis: Secondary | ICD-10-CM | POA: Diagnosis not present

## 2018-03-05 DIAGNOSIS — I10 Essential (primary) hypertension: Secondary | ICD-10-CM | POA: Insufficient documentation

## 2018-03-05 NOTE — Progress Notes (Signed)
This encounter was created in error - please disregard.

## 2018-03-05 NOTE — Progress Notes (Signed)
Pulmonary Individual Treatment Plan  Patient Details  Name: Kim Owen MRN: [id number] Date of Birth: November 18, 1934 Referring Provider:     Pulmonary Rehab from 01/02/2018 in [facility] Cardiac and Pulmonary Rehab  Referring Provider  Liane Comber      Initial Encounter Date:    Pulmonary Rehab from 01/02/2018 in [facility] Cardiac and Pulmonary Rehab  Date  01/02/18      Visit Diagnosis: Chronic obstructive pulmonary disease, unspecified COPD type ([place])  Patient's Home Medications on Admission:  Current Outpatient Medications:  .  albuterol (PROVENTIL HFA;VENTOLIN HFA) 108 (90 Base) MCG/ACT inhaler, Inhale 2 puffs into the lungs every 6 (six) hours as needed for wheezing or shortness of breath., Disp: , Rfl:  .  amLODipine (NORVASC) 5 MG tablet, Take 5 mg by mouth daily., Disp: , Rfl:  .  azelastine (ASTELIN) 0.1 % nasal spray, Place 2 sprays into both nostrils 2 (two) times daily. Use in each nostril as directed, Disp: , Rfl:  .  diclofenac sodium (VOLTAREN) 1 % GEL, Apply 2 g topically 4 (four) times daily., Disp: , Rfl:  .  esomeprazole (NEXIUM) 40 MG capsule, Take 40 mg by mouth daily at 12 noon., Disp: , Rfl:  .  Fluticasone-Salmeterol (ADVAIR) 250-50 MCG/DOSE AEPB, Inhale 1 puff into the lungs 2 (two) times daily., Disp: , Rfl:  .  HYDROcodone-acetaminophen (NORCO/VICODIN) 5-325 MG tablet, 1-2 tabs po qd prn severe pain, Disp: 6 tablet, Rfl: 0 .  levothyroxine (SYNTHROID, LEVOTHROID) 25 MCG tablet, Take 25 mcg by mouth daily before breakfast., Disp: , Rfl:  .  MOMETASONE FUROATE NA, Place into the nose., Disp: , Rfl:  .  montelukast (SINGULAIR) 10 MG tablet, Take 10 mg by mouth at bedtime., Disp: , Rfl:  .  predniSONE (DELTASONE) 20 MG tablet, 2 tabs po qd for 2 days, then 1 tab po qd for 2 days, then half at tab po qd for 2 days, Disp: 7 tablet, Rfl: 0 .  SUMAtriptan (IMITREX) 50 MG tablet, Take 50 mg by mouth every 2 (two) hours as needed for migraine. May repeat in 2 hours if headache  persists or recurs., Disp: , Rfl:  .  tiotropium (SPIRIVA) 18 MCG inhalation capsule, Place 18 mcg into inhaler and inhale daily., Disp: , Rfl:  .  topiramate (TOPAMAX) 50 MG tablet, Take 50 mg by mouth 2 (two) times daily., Disp: , Rfl:   Past Medical History: Past Medical History:  Diagnosis Date  . Asthma   . Barrett esophagus   . Cataract   . Colitis   . Fatty liver   . GERD (gastroesophageal reflux disease)   . Herpes zoster   . Hypertension   . PVD (peripheral vascular disease) ([place])     Tobacco Use: Social History   Tobacco Use  Smoking Status Never Smoker  Smokeless Tobacco Never Used    Labs: Recent Review Flowsheet Data    There is no flowsheet data to display.       Pulmonary Assessment Scores: Pulmonary Assessment Scores    Row Name 01/02/18 1116         ADL UCSD   ADL Phase  Entry     SOB Score total  63     Rest  0     Walk  3     Stairs  4     Bath  2     Dress  2     Shop  2       CAT Score  CAT Score  26       mMRC Score   mMRC Score  3        Pulmonary Function Assessment: Pulmonary Function Assessment - 01/02/18 1108      Pulmonary Function Tests   FVC%  116.9 % PFT done on 01/13/17    FEV1%  109.8 %    FEV1/FVC Ratio  70.01      Breath   Bilateral Breath Sounds  Clear    Shortness of Breath  Limiting activity;Yes       Exercise Target Goals:    Exercise Program Goal: Individual exercise prescription set using results from initial 6 min walk test and THRR while considering  patient's activity barriers and safety.    Exercise Prescription Goal: Initial exercise prescription builds to 30-45 minutes a day of aerobic activity, 2-3 days per week.  Home exercise guidelines will be given to patient during program as part of exercise prescription that the participant will acknowledge.  Activity Barriers & Risk Stratification:   6 Minute Walk: 6 Minute Walk    Row Name 01/02/18 1139         6 Minute Walk   Distance   960 feet     Walk Time  6 minutes     # of Rest Breaks  0     MPH  1.82     METS  2.04     RPE  16     Perceived Dyspnea   4     VO2 Peak  7.15     Symptoms  No     Resting HR  75 bpm     Resting BP  126/52     Resting Oxygen Saturation   100 %     Exercise Oxygen Saturation  during 6 min walk  98 %     Max Ex. HR  99 bpm     Max Ex. BP  160/60     2 Minute Post BP  132/60       Interval HR   1 Minute HR  90     2 Minute HR  94     4 Minute HR  91     5 Minute HR  96     6 Minute HR  99     2 Minute Post HR  84     Interval Heart Rate?  Yes       Interval Oxygen   Interval Oxygen?  Yes     Baseline Oxygen Saturation %  100 %     1 Minute Oxygen Saturation %  100 %     1 Minute Liters of Oxygen  0 L     2 Minute Oxygen Saturation %  99 %     2 Minute Liters of Oxygen  0 L     3 Minute Liters of Oxygen  0 L     4 Minute Oxygen Saturation %  100 %     4 Minute Liters of Oxygen  0 L     5 Minute Oxygen Saturation %  100 %     5 Minute Liters of Oxygen  0 L     6 Minute Oxygen Saturation %  99 %     6 Minute Liters of Oxygen  0 L     2 Minute Post Oxygen Saturation %  98 %     2 Minute Post Liters of Oxygen  0 L  Oxygen Initial Assessment: Oxygen Initial Assessment - 01/02/18 1122      Home Oxygen   Home Oxygen Device  None    Sleep Oxygen Prescription  None    Home Exercise Oxygen Prescription  None    Home at Rest Exercise Oxygen Prescription  None      Initial 6 min Walk   Oxygen Used  None      Program Oxygen Prescription   Program Oxygen Prescription  None      Intervention   Short Term Goals  To learn and demonstrate proper use of respiratory medications;To learn and demonstrate proper pursed lip breathing techniques or other breathing techniques.;To learn and understand importance of maintaining oxygen saturations>88%;To learn and understand importance of monitoring SPO2 with pulse oximeter and demonstrate accurate use of the pulse oximeter.     Long  Term Goals  Demonstrates proper use of MDI's;Compliance with respiratory medication;Exhibits proper breathing techniques, such as pursed lip breathing or other method taught during program session;Maintenance of O2 saturations>88%;Verbalizes importance of monitoring SPO2 with pulse oximeter and return demonstration       Oxygen Re-Evaluation: Oxygen Re-Evaluation    Row Name 01/03/18 1117 01/31/18 1218 02/28/18 1248         Program Oxygen Prescription   Program Oxygen Prescription  None  None  None       Home Oxygen   Home Oxygen Device  None  None  None     Sleep Oxygen Prescription  None  None  None     Home Exercise Oxygen Prescription  None  None  None     Home at Rest Exercise Oxygen Prescription  None  None  None     Compliance with Home Oxygen Use  -  -  No       Goals/Expected Outcomes   Short Term Goals  To learn and demonstrate proper use of respiratory medications;To learn and demonstrate proper pursed lip breathing techniques or other breathing techniques.;To learn and understand importance of maintaining oxygen saturations>88%;To learn and understand importance of monitoring SPO2 with pulse oximeter and demonstrate accurate use of the pulse oximeter.  To learn and demonstrate proper use of respiratory medications;To learn and demonstrate proper pursed lip breathing techniques or other breathing techniques.;To learn and understand importance of maintaining oxygen saturations>88%;To learn and understand importance of monitoring SPO2 with pulse oximeter and demonstrate accurate use of the pulse oximeter.  To learn and demonstrate proper use of respiratory medications;To learn and demonstrate proper pursed lip breathing techniques or other breathing techniques.;To learn and understand importance of maintaining oxygen saturations>88%;To learn and understand importance of monitoring SPO2 with pulse oximeter and demonstrate accurate use of the pulse oximeter.     Long  Term Goals   Demonstrates proper use of MDI's;Compliance with respiratory medication;Exhibits proper breathing techniques, such as pursed lip breathing or other method taught during program session;Maintenance of O2 saturations>88%;Verbalizes importance of monitoring SPO2 with pulse oximeter and return demonstration  Demonstrates proper use of MDI's;Compliance with respiratory medication;Exhibits proper breathing techniques, such as pursed lip breathing or other method taught during program session;Maintenance of O2 saturations>88%;Verbalizes importance of monitoring SPO2 with pulse oximeter and return demonstration  Demonstrates proper use of MDI's;Compliance with respiratory medication;Exhibits proper breathing techniques, such as pursed lip breathing or other method taught during program session;Maintenance of O2 saturations>88%;Verbalizes importance of monitoring SPO2 with pulse oximeter and return demonstration     Comments  Reviewed PLB technique with pt.  Talked about how it work and  it's important to maintaining his exercise saturations.    Harini started using the Advair inhaler twice a day. She has albuterol for a rescue inhaler if she needs it. Her oxygen has been in the high 90's when exercising. She uses PLB in class but needs to use it more when she is at home.  Laural is using her medications properly. She has not needed to use her rescue inhaler. She can't tell a huge difference with her SOB at home, but is trying to practice PLB more.      Goals/Expected Outcomes  Short: Become more profiecient at using PLB.   Long: Become independent at using PLB.  Short: use PLB at home for shortness of breath. Long: use PLB at home independently  Short: become independent with PLB at home. Long: maintain independence with PLB and respiratory medication use.        Oxygen Discharge (Final Oxygen Re-Evaluation): Oxygen Re-Evaluation - 02/28/18 1248      Program Oxygen Prescription   Program Oxygen Prescription   None      Home Oxygen   Home Oxygen Device  None    Sleep Oxygen Prescription  None    Home Exercise Oxygen Prescription  None    Home at Rest Exercise Oxygen Prescription  None    Compliance with Home Oxygen Use  No      Goals/Expected Outcomes   Short Term Goals  To learn and demonstrate proper use of respiratory medications;To learn and demonstrate proper pursed lip breathing techniques or other breathing techniques.;To learn and understand importance of maintaining oxygen saturations>88%;To learn and understand importance of monitoring SPO2 with pulse oximeter and demonstrate accurate use of the pulse oximeter.    Long  Term Goals  Demonstrates proper use of MDI's;Compliance with respiratory medication;Exhibits proper breathing techniques, such as pursed lip breathing or other method taught during program session;Maintenance of O2 saturations>88%;Verbalizes importance of monitoring SPO2 with pulse oximeter and return demonstration    Comments  Vadie is using her medications properly. She has not needed to use her rescue inhaler. She can't tell a huge difference with her SOB at home, but is trying to practice PLB more.     Goals/Expected Outcomes  Short: become independent with PLB at home. Long: maintain independence with PLB and respiratory medication use.       Initial Exercise Prescription: Initial Exercise Prescription - 01/02/18 1100      Date of Initial Exercise RX and Referring Provider   Date  01/02/18    Referring Provider  Liane Comber      Treadmill   MPH  1.3    Grade  0.5    Minutes  15    METs  2.09      NuStep   Level  2    SPM  80    Minutes  15    METs  2      Arm Ergometer   Level  1    Watts  22    RPM  25    Minutes  15    METs  2      Biostep-RELP   Level  2    SPM  45    Minutes  15    METs  2      Prescription Details   Frequency (times per week)  3    Duration  Progress to 45 minutes of aerobic exercise without signs/symptoms of physical  distress      Intensity  THRR 40-80% of Max Heartrate  101-126    Ratings of Perceived Exertion  11-13    Perceived Dyspnea  0-4      Resistance Training   Training Prescription  Yes    Weight  3 lb    Reps  10-15       Perform Capillary Blood Glucose checks as needed.  Exercise Prescription Changes: Exercise Prescription Changes    Row Name 01/05/18 1200 01/09/18 1000 01/23/18 1300 02/08/18 1500 02/20/18 1400     Response to Exercise   Blood Pressure (Admit)  -  148/64  130/60  140/60  140/82   Blood Pressure (Exercise)  -  132/80  -  -  -   Blood Pressure (Exit)  -  118/60  130/72  118/56  122/58   Heart Rate (Admit)  -  76 bpm  82 bpm  71 bpm  80 bpm   Heart Rate (Exercise)  -  88 bpm  90 bpm  92 bpm  96 bpm   Heart Rate (Exit)  -  64 bpm  72 bpm  71 bpm  84 bpm   Oxygen Saturation (Admit)  -  97 %  99 %  99 %  97 %   Oxygen Saturation (Exercise)  -  97 %  99 %  94 %  89 %   Oxygen Saturation (Exit)  -  99 %  98 %  100 %  95 %   Rating of Perceived Exertion (Exercise)  -  '12  13  13  15   ' Perceived Dyspnea (Exercise)  -  0  '2  2  2   ' Symptoms  -  none  none  none  none   Duration  -  Continue with 45 min of aerobic exercise without signs/symptoms of physical distress.  Continue with 45 min of aerobic exercise without signs/symptoms of physical distress.  Continue with 45 min of aerobic exercise without signs/symptoms of physical distress.  Continue with 45 min of aerobic exercise without signs/symptoms of physical distress.   Intensity  -  THRR unchanged  THRR unchanged  THRR unchanged  THRR unchanged     Progression   Progression  -  Continue to progress workloads to maintain intensity without signs/symptoms of physical distress.  Continue to progress workloads to maintain intensity without signs/symptoms of physical distress.  Continue to progress workloads to maintain intensity without signs/symptoms of physical distress.  Continue to progress workloads to maintain  intensity without signs/symptoms of physical distress.   Average METs  -  2.11  2.45  -  3.09     Resistance Training   Training Prescription  -  Yes  Yes  Yes  Yes   Weight  -  3 lbs  3 lbs  3 lb  4 lbs   Reps  -  10-15  10-15  10-15  10-15     Interval Training   Interval Training  -  No  No  No  No     Treadmill   MPH  -  1.7  2.1  -  2.2   Grade  -  0.5  0.5  -  0   Minutes  -  15  15  -  15   METs  -  2.42  2.75  -  2.68     NuStep   Level  -  '3  3  4  4   ' SPM  -  67  90  -  75   Minutes  -  '15  15  15  15   ' METs  -  1.9  2.6  -  2.6     Biostep-RELP   Level  -  '1  2  5  5   ' SPM  -  45  -  43  43   Minutes  -  '15  15  15  15   ' METs  -  '2  2  2  4     ' Home Exercise Plan   Plans to continue exercise at  Home (comment) walk with friend at [facility] (comment) walk with friend at [facility] (comment) walk with friend at [facility] (comment) walk with friend at [facility] (comment) walk with friend at [facility]   Frequency  Add 3 additional days to program exercise sessions.  Add 3 additional days to program exercise sessions.  Add 3 additional days to program exercise sessions.  Add 3 additional days to program exercise sessions.  Add 3 additional days to program exercise sessions.   Initial Home Exercises Provided  01/05/18 [facility]  01/05/18 [facility]  01/05/18 [facility]  01/05/18 [facility]  01/05/18 [facility]      Exercise Comments: Exercise Comments    Row Name 01/03/18 1116           Exercise Comments  First full day of exercise!  Patient was oriented to gym and equipment including functions, settings, policies, and procedures.  Patient's individual exercise prescription and treatment plan were reviewed.  All starting workloads were established based on the results of the 6 minute walk test done at initial orientation visit.  The plan for exercise progression was also introduced and progression will be customized  based on patient's performance and goals.          Exercise Goals and Review: Exercise Goals    Row Name 01/02/18 1139             Exercise Goals   Increase Physical Activity  Yes       Intervention  Provide advice, education, support and counseling about physical activity/exercise needs.;Develop an individualized exercise prescription for aerobic and resistive training based on initial evaluation findings, risk stratification, comorbidities and participant's personal goals.       Expected Outcomes  Short Term: Attend rehab on a regular basis to increase amount of physical activity.;Long Term: Add in home exercise to make exercise part of routine and to increase amount of physical activity.;Long Term: Exercising regularly at least 3-5 days a week.       Increase Strength and Stamina  Yes       Intervention  Provide advice, education, support and counseling about physical activity/exercise needs.;Develop an individualized exercise prescription for aerobic and resistive training based on initial evaluation findings, risk stratification, comorbidities and participant's personal goals.       Expected Outcomes  Short Term: Increase workloads from initial exercise prescription for resistance, speed, and METs.;Short Term: Perform resistance training exercises routinely during rehab and add in resistance training at home;Long Term: Improve cardiorespiratory fitness, muscular endurance and strength as measured by increased METs and functional capacity (6MWT)       Able to understand and use rate of perceived exertion (RPE) scale  Yes       Intervention  Provide education and explanation on how to use RPE scale       Expected Outcomes  Short Term: Able to use RPE daily in rehab to express subjective intensity level;Long Term:  Able to use RPE to guide intensity level when exercising independently       Able to understand and use Dyspnea scale  Yes       Intervention  Provide education and explanation on  how to use Dyspnea scale       Expected Outcomes  Short Term: Able to use Dyspnea scale daily in rehab to express subjective sense of shortness of breath during exertion;Long Term: Able to use Dyspnea scale to guide intensity level when exercising independently       Knowledge and understanding of Target Heart Rate Range (THRR)  Yes       Intervention  Provide education and explanation of THRR including how the numbers were predicted and where they are located for reference       Expected Outcomes  Short Term: Able to state/look up THRR;Short Term: Able to use daily as guideline for intensity in rehab;Long Term: Able to use THRR to govern intensity when exercising independently       Able to check pulse independently  Yes       Intervention  Provide education and demonstration on how to check pulse in carotid and radial arteries.;Review the importance of being able to check your own pulse for safety during independent exercise       Expected Outcomes  Short Term: Able to explain why pulse checking is important during independent exercise;Long Term: Able to check pulse independently and accurately       Understanding of Exercise Prescription  Yes       Intervention  Provide education, explanation, and written materials on patient's individual exercise prescription       Expected Outcomes  Short Term: Able to explain program exercise prescription;Long Term: Able to explain home exercise prescription to exercise independently          Exercise Goals Re-Evaluation : Exercise Goals Re-Evaluation    Row Name 01/03/18 1116 01/05/18 1233 01/09/18 1025 01/23/18 1339 02/08/18 1512     Exercise Goal Re-Evaluation   Exercise Goals Review  Understanding of Exercise Prescription;Able to understand and use Dyspnea scale;Knowledge and understanding of Target Heart Rate Range (THRR);Able to understand and use rate of perceived exertion (RPE) scale  Increase Physical Activity;Able to understand and use Dyspnea  scale;Understanding of Exercise Prescription;Increase Strength and Stamina;Knowledge and understanding of Target Heart Rate Range (THRR);Able to understand and use rate of perceived exertion (RPE) scale;Able to check pulse independently  Increase Physical Activity;Understanding of Exercise Prescription;Increase Strength and Stamina  Increase Physical Activity;Understanding of Exercise Prescription;Increase Strength and Stamina  Increase Physical Activity;Able to understand and use rate of perceived exertion (RPE) scale;Increase Strength and Stamina;Able to understand and use Dyspnea scale   Comments  Reviewed RPE scale, THR and program prescription with pt today.  Pt voiced understanding and was given a copy of goals to take home.   Reviewed home exercise with pt today.  Pt plans to walk at [facility] with friend for exercise.  Reviewed THR, pulse, RPE, sign and symptoms, NTG use, and when to call 911 or MD.  Also discussed weather considerations and indoor options.  Pt voiced understanding.  Debria has been doing well since returning to rehab.  She has completed three full days of exercise.  She is already up to level 3 on the NuStep.  We will continue to monitor her progression.   Rudell continues to do well  in rehab.  She is now up to 2.1 mph on the treadmill and level 2 on the BioStep!   We will continue to monitor her progression.   Pt is progressing well with exercise   Expected Outcomes  Short: Use RPE daily to regulate intensity.  Long: Follow program prescription in THR.  Short: Start with 3 days walking with friend.  Long: Continue to exercise independently  Short: Start with 3 days walking with friend.  Long: Continue to exercise independently   Short: Increase workload on NuStep to level 4.  Long: Continue to add in more exercise at home.   SHort - continue to attend class 3 times per week Long - improve overall MET level   Row Name 02/20/18 1442             Exercise Goal  Re-Evaluation   Exercise Goals Review  Increase Physical Activity;Understanding of Exercise Prescription;Increase Strength and Stamina       Comments  Oksana continues to do well with exercise.  She is now up to level 5 on the BioStep.  We will continue to monitor her progression.       Expected Outcomes   Short: Add incline to treadmill. Long: Continue exercise at home on off days.           Discharge Exercise Prescription (Final Exercise Prescription Changes): Exercise Prescription Changes - 02/20/18 1400      Response to Exercise   Blood Pressure (Admit)  140/82    Blood Pressure (Exit)  122/58    Heart Rate (Admit)  80 bpm    Heart Rate (Exercise)  96 bpm    Heart Rate (Exit)  84 bpm    Oxygen Saturation (Admit)  97 %    Oxygen Saturation (Exercise)  89 %    Oxygen Saturation (Exit)  95 %    Rating of Perceived Exertion (Exercise)  15    Perceived Dyspnea (Exercise)  2    Symptoms  none    Duration  Continue with 45 min of aerobic exercise without signs/symptoms of physical distress.    Intensity  THRR unchanged      Progression   Progression  Continue to progress workloads to maintain intensity without signs/symptoms of physical distress.    Average METs  3.09      Resistance Training   Training Prescription  Yes    Weight  4 lbs    Reps  10-15      Interval Training   Interval Training  No      Treadmill   MPH  2.2    Grade  0    Minutes  15    METs  2.68      NuStep   Level  4    SPM  75    Minutes  15    METs  2.6      Biostep-RELP   Level  5    SPM  43    Minutes  15    METs  4      Home Exercise Plan   Plans to continue exercise at  Home (comment) walk with friend at [facility]    Frequency  Add 3 additional days to program exercise sessions.    Initial Home Exercises Provided  01/05/18 [facility]       Nutrition:  Target Goals: Understanding of nutrition guidelines, daily intake of sodium <1583m, cholesterol <2087m calories 30% from  fat and 7% or less from saturated  fats, daily to have 5 or more servings of fruits and vegetables.  Biometrics: Pre Biometrics - 01/02/18 1138      Pre Biometrics   Height  '5\' 6"'  (1.676 m)    Weight  153 lb 9.6 oz (69.7 kg)    Waist Circumference  36.25 inches    Hip Circumference  39 inches    Waist to Hip Ratio  0.93 %    BMI (Calculated)  24.8        Nutrition Therapy Plan and Nutrition Goals: Nutrition Therapy & Goals - 01/02/18 1115      Personal Nutrition Goals   Nutrition Goal  Learn to eat better and lose some weight.    Comments  She eats lot of vegetables and wants to learn to eat better.      Intervention Plan   Intervention  Prescribe, educate and counsel regarding individualized specific dietary modifications aiming towards targeted core components such as weight, hypertension, lipid management, diabetes, heart failure and other comorbidities.    Expected Outcomes  Short Term Goal: Understand basic principles of dietary content, such as calories, fat, sodium, cholesterol and nutrients.;Long Term Goal: Adherence to prescribed nutrition plan.       Nutrition Assessments: Nutrition Assessments - 01/02/18 1115      MEDFICTS Scores   Pre Score  27       Nutrition Goals Re-Evaluation: Nutrition Goals Re-Evaluation    [place] Name 01/31/18 1223 02/28/18 1245           Goals   Current Weight  148 lb (67.1 kg)  149 lb (67.6 kg)      Nutrition Goal  Eat healthier and maintain weight  Kyannah has met her goal weight of 150lb and continues to maintain right below it. She reports not eating a lot, especially now that her swalling problem is back.        Comment  Latasha lipids are high and needs to change her diet to help. She tries to eat better and has met with the dietician. She may need to take medication for her cholesterol but she is unsure if she wants to do so.  She goes this week to have another procedure done. She has a history of a growth that had to be removed  in her esophagus, but she hopes it hasnt come back because she does not want to have that done again. If she doesn't chew her food for a long time, she ends up throwing it back up.      Expected Outcome  Short: eat more vegatable and watch foods high in cholesterol. Long: get Lipid levels lower by dieting and exercising  Short: go to the procedure Thursday and figure out what is causing the swallowing problems. Long: work on eating more balanced meals (more protein and less cholesterol)          Nutrition Goals Discharge (Final Nutrition Goals Re-Evaluation): Nutrition Goals Re-Evaluation - 02/28/18 1245      Goals   Current Weight  149 lb (67.6 kg)    Nutrition Goal  Antanisha has met her goal weight of 150lb and continues to maintain right below it. She reports not eating a lot, especially now that her swalling problem is back.      Comment  She goes this week to have another procedure done. She has a history of a growth that had to be removed in her esophagus, but she hopes it hasnt come back because she does not want to  have that done again. If she doesn't chew her food for a long time, she ends up throwing it back up.    Expected Outcome  Short: go to the procedure Thursday and figure out what is causing the swallowing problems. Long: work on eating more balanced meals (more protein and less cholesterol)        Psychosocial: Target Goals: Acknowledge presence or absence of significant depression and/or stress, maximize coping skills, provide positive support system. Participant is able to verbalize types and ability to use techniques and skills needed for reducing stress and depression.   Initial Review & Psychosocial Screening: Initial Psych Review & Screening - 01/02/18 1115      Initial Review   Current issues with  Current Depression;Current Sleep Concerns;Current Stress Concerns    Source of Stress Concerns  Chronic Illness;Family;Unable to perform yard/household activities;Unable to  participate in former interests or hobbies    Comments  Her family is still having some issues but is working through it      [place]?  Yes    Comments  Her son, daughter in law and daughter.      Barriers   Psychosocial barriers to participate in program  The patient should benefit from training in stress management and relaxation.      Screening Interventions   Interventions  To provide support and resources with identified psychosocial needs;Program counselor consult;Encouraged to exercise;Provide feedback about the scores to participant    Expected Outcomes  Long Term goal: The participant improves quality of Life and PHQ9 Scores as seen by post scores and/or verbalization of changes;Short Term goal: Identification and review with participant of any Quality of Life or Depression concerns found by scoring the questionnaire.;Long Term Goal: Stressors or current issues are controlled or eliminated.;Short Term goal: Utilizing psychosocial counselor, staff and physician to assist with identification of specific Stressors or current issues interfering with healing process. Setting desired goal for each stressor or current issue identified.       Quality of Life Scores:  Scores of 19 and below usually indicate a poorer quality of life in these areas.  A difference of  2-3 points is a clinically meaningful difference.  A difference of 2-3 points in the total score of the Quality of Life Index has been associated with significant improvement in overall quality of life, self-image, physical symptoms, and general health in studies assessing change in quality of life.  PHQ-9: Recent Review Flowsheet Data    Depression screen [facility] 2/9 01/02/2018 09/18/2017   Decreased Interest 2 3   Down, Depressed, Hopeless 1 2   PHQ - 2 Score 3 5   Altered sleeping 0 2   Tired, decreased energy 1 3   Change in appetite 0 0   Feeling bad or failure about yourself  1 1   Trouble  concentrating 1 1   Moving slowly or fidgety/restless 0 1   Suicidal thoughts 0 0   PHQ-9 Score 6 13   Difficult doing work/chores Somewhat difficult Somewhat difficult     Interpretation of Total Score  Total Score Depression Severity:  1-4 = Minimal depression, 5-9 = Mild depression, 10-14 = Moderate depression, 15-19 = Moderately severe depression, 20-27 = Severe depression   Psychosocial Evaluation and Intervention: Psychosocial Evaluation - 01/31/18 1231      Psychosocial Evaluation & Interventions   Interventions  Encouraged to exercise with the program and follow exercise prescription;Stress management education;Relaxation education  Comments  Counselor met with Ms. Windholz today Silva Bandy) for initial psychosocial evaluation.  She is an 83 year old who lives in an in-law apartment connected to the home of her son and daughter-in-law.  She also has a strong support system with friends in her church community.  Silva Bandy struggles with several health issues including Asthma; HBP, and high cholesterol.  She sleeps well; has a good appetite; and denies a history of depression or anxiety.  However, Silva Bandy is struggling with current anxiety symptoms due to some family issues.  Counselor provided support and encouraged Silva Bandy to speak with her pastor again.  She will be having her annual physical in a few weeks and counselor encouraged her to speak with the Dr. about this stress if it has not improved at that time.  She exercises; talks to others; and her faith helps her through this tough time.  She has goals to breathe better; and to be "better/stronger" while in this program.  Staff will follow with Silva Bandy.    Expected Outcomes  Short:  Silva Bandy will find support/resources to help manage her current stress better.    Long:  Silva Bandy will exercise for stress reduction and her overall health.    Continue Psychosocial Services   Follow up required by staff       Psychosocial  Re-Evaluation:   Psychosocial Discharge (Final Psychosocial Re-Evaluation):   Education: Education Goals: Education classes will be provided on a weekly basis, covering required topics. Participant will state understanding/return demonstration of topics presented.  Learning Barriers/Preferences: Learning Barriers/Preferences - 01/02/18 1107      Learning Barriers/Preferences   Learning Barriers  Sight Wears reading glasses    Learning Preferences  None       Education Topics:  Initial Evaluation Education: - Verbal, written and demonstration of respiratory meds, oximetry and breathing techniques. Instruction on use of nebulizers and MDIs and importance of monitoring MDI activations.   Pulmonary Rehab from 02/28/2018 in [facility] Cardiac and Pulmonary Rehab  Date  01/02/18  Educator  [facility]  Instruction Review Code  1- Verbalizes Understanding      General Nutrition Guidelines/Fats and Fiber: -Group instruction provided by verbal, written material, models and posters to present the general guidelines for heart healthy nutrition. Gives an explanation and review of dietary fats and fiber.   Pulmonary Rehab from 02/28/2018 in [facility] Cardiac and Pulmonary Rehab  Date  01/22/18  Educator  CR  Instruction Review Code  1- Verbalizes Understanding      Controlling Sodium/Reading Food Labels: -Group verbal and written material supporting the discussion of sodium use in heart healthy nutrition. Review and explanation with models, verbal and written materials for utilization of the food label.   Pulmonary Rehab from 02/28/2018 in [facility] Cardiac and Pulmonary Rehab  Date  02/05/18  Educator  CR  Instruction Review Code  1- Verbalizes Understanding      Exercise Physiology & General Exercise Guidelines: - Group verbal and written instruction with models to review the exercise physiology of the cardiovascular system and associated critical values. Provides general exercise guidelines with specific  guidelines to those with heart or lung disease.    Aerobic Exercise & Resistance Training: - Gives group verbal and written instruction on the various components of exercise. Focuses on aerobic and resistive training programs and the benefits of this training and how to safely progress through these programs.   Flexibility, Balance, Mind/Body Relaxation: Provides group verbal/written instruction on the benefits of flexibility and balance training, including mind/body  exercise modes such as yoga, pilates and tai chi.  Demonstration and skill practice provided.   Pulmonary Rehab from 02/28/2018 in [facility] Cardiac and Pulmonary Rehab  Date  01/10/18  Educator  AS  Instruction Review Code  1- Verbalizes Understanding      Stress and Anxiety: - Provides group verbal and written instruction about the health risks of elevated stress and causes of high stress.  Discuss the correlation between heart/lung disease and anxiety and treatment options. Review healthy ways to manage with stress and anxiety.   Pulmonary Rehab from 02/28/2018 in [facility] Cardiac and Pulmonary Rehab  Date  01/31/18  Educator  [facility]  Instruction Review Code  1- Verbalizes Understanding      Depression: - Provides group verbal and written instruction on the correlation between heart/lung disease and depressed mood, treatment options, and the stigmas associated with seeking treatment.   Pulmonary Rehab from 02/28/2018 in [facility] Cardiac and Pulmonary Rehab  Date  01/17/18  Educator  [facility]  Instruction Review Code  1- Verbalizes Understanding      Exercise & Equipment Safety: - Individual verbal instruction and demonstration of equipment use and safety with use of the equipment.   Pulmonary Rehab from 02/28/2018 in [facility] Cardiac and Pulmonary Rehab  Date  01/02/18  Educator  [facility]  Instruction Review Code  1- Verbalizes Understanding      Infection Prevention: - Provides verbal and written material to individual with discussion of  infection control including proper hand washing and proper equipment cleaning during exercise session.   Pulmonary Rehab from 02/28/2018 in [facility] Cardiac and Pulmonary Rehab  Date  01/02/18  Educator  [facility]  Instruction Review Code  1- Verbalizes Understanding      Falls Prevention: - Provides verbal and written material to individual with discussion of falls prevention and safety.   Pulmonary Rehab from 02/28/2018 in [facility] Cardiac and Pulmonary Rehab  Date  01/02/18  Educator  [facility]  Instruction Review Code  1- Verbalizes Understanding      Diabetes: - Individual verbal and written instruction to review signs/symptoms of diabetes, desired ranges of glucose level fasting, after meals and with exercise. Advice that pre and post exercise glucose checks will be done for 3 sessions at entry of program.   Chronic Lung Diseases: - Group verbal and written instruction to review updates, respiratory medications, advancements in procedures and treatments. Discuss use of supplemental oxygen including available portable oxygen systems, continuous and intermittent flow rates, concentrators, personal use and safety guidelines. Review proper use of inhaler and spacers. Provide informative websites for self-education.    Pulmonary Rehab from 02/28/2018 in [facility] Cardiac and Pulmonary Rehab  Date  02/21/18  Educator  [facility]  Instruction Review Code  1- Verbalizes Understanding      Energy Conservation: - Provide group verbal and written instruction for methods to conserve energy, plan and organize activities. Instruct on pacing techniques, use of adaptive equipment and posture/positioning to relieve shortness of breath.   Triggers and Exacerbations: - Group verbal and written instruction to review types of environmental triggers and ways to prevent exacerbations. Discuss weather changes, air quality and the benefits of nasal washing. Review warning signs and symptoms to help prevent infections. Discuss techniques  for effective airway clearance, coughing, and vibrations.   AED/CPR: - Group verbal and written instruction with the use of models to demonstrate the basic use of the AED with the basic ABC's of resuscitation.   Anatomy and Physiology of the Lungs: - Group verbal and  written instruction with the use of models to provide basic lung anatomy and physiology related to function, structure and complications of lung disease.   Anatomy & Physiology of the Heart: - Group verbal and written instruction and models provide basic cardiac anatomy and physiology, with the coronary electrical and arterial systems. Review of Valvular disease and Heart Failure   Pulmonary Rehab from 02/28/2018 in [facility] Cardiac and Pulmonary Rehab  Date  01/03/18  Educator  [facility]  Instruction Review Code  1- Verbalizes Understanding      Cardiac Medications: - Group verbal and written instruction to review commonly prescribed medications for heart disease. Reviews the medication, class of the drug, and side effects.   Know Your Numbers and Risk Factors: -Group verbal and written instruction about important numbers in your health.  Discussion of what are risk factors and how they play a role in the disease process.  Review of Cholesterol, Blood Pressure, Diabetes, and BMI and the role they play in your overall health.   Sleep Hygiene: -Provides group verbal and written instruction about how sleep can affect your health.  Define sleep hygiene, discuss sleep cycles and impact of sleep habits. Review good sleep hygiene tips.    Pulmonary Rehab from 02/28/2018 in [facility] Cardiac and Pulmonary Rehab  Date  02/28/18  Educator  [facility]  Instruction Review Code  1- Verbalizes Understanding      Other: -Provides group and verbal instruction on various topics (see comments)   Pulmonary Rehab from 10/02/2017 in [facility] Cardiac and Pulmonary Rehab  Date  09/27/17  Educator  [facility]  Instruction Review Code  1- Verbalizes Understanding        Knowledge Questionnaire Score: Knowledge Questionnaire Score - 01/02/18 1119      Knowledge Questionnaire Score   Pre Score  13/18 reviewed with patient        Core Components/Risk Factors/Patient Goals at Admission: Personal Goals and Risk Factors at Admission - 01/02/18 1123      Core Components/Risk Factors/Patient Goals on Admission    Weight Management  Yes;Weight Loss    Intervention  Weight Management: Develop a combined nutrition and exercise program designed to reach desired caloric intake, while maintaining appropriate intake of nutrient and fiber, sodium and fats, and appropriate energy expenditure required for the weight goal.;Weight Management: Provide education and appropriate resources to help participant work on and attain dietary goals.;Weight Management/Obesity: Establish reasonable short term and long term weight goals.    Admit Weight  153 lb 9.6 oz (69.7 kg)    Goal Weight: Short Term  148 lb (67.1 kg)    Goal Weight: Long Term  148 lb (67.1 kg)    Expected Outcomes  Short Term: Continue to assess and modify interventions until short term weight is achieved;Long Term: Adherence to nutrition and physical activity/exercise program aimed toward attainment of established weight goal;Weight Loss: Understanding of general recommendations for a balanced deficit meal plan, which promotes 1-2 lb weight loss per week and includes a negative energy balance of [phone] kcal/d;Understanding recommendations for meals to include 15-35% energy as protein, 25-35% energy from fat, 35-60% energy from carbohydrates, less than 273m of dietary cholesterol, 20-35 gm of total fiber daily;Understanding of distribution of calorie intake throughout the day with the consumption of 4-5 meals/snacks;Weight Maintenance: Understanding of the daily nutrition guidelines, which includes 25-35% calories from fat, 7% or less cal from saturated fats, less than 2073mcholesterol, less than 1.5gm of sodium, &  5 or more servings of fruits and vegetables  daily    Improve shortness of breath with ADL's  Yes    Intervention  Provide education, individualized exercise plan and daily activity instruction to help decrease symptoms of SOB with activities of daily living.    Expected Outcomes  Short Term: Improve cardiorespiratory fitness to achieve a reduction of symptoms when performing ADLs;Long Term: Be able to perform more ADLs without symptoms or delay the onset of symptoms    Hypertension  Yes    Intervention  Provide education on lifestyle modifcations including regular physical activity/exercise, weight management, moderate sodium restriction and increased consumption of fresh fruit, vegetables, and low fat dairy, alcohol moderation, and smoking cessation.;Monitor prescription use compliance.    Expected Outcomes  Short Term: Continued assessment and intervention until BP is < 140/61m HG in hypertensive participants. < 130/834mHG in hypertensive participants with diabetes, heart failure or chronic kidney disease.;Long Term: Maintenance of blood pressure at goal levels.    Lipids  Yes    Intervention  Provide education and support for participant on nutrition & aerobic/resistive exercise along with prescribed medications to achieve LDL <7012mHDL >33m49m  Expected Outcomes  Short Term: Participant states understanding of desired cholesterol values and is compliant with medications prescribed. Participant is following exercise prescription and nutrition guidelines.;Long Term: Cholesterol controlled with medications as prescribed, with individualized exercise RX and with personalized nutrition plan. Value goals: LDL < 70mg35mL > 40 mg.       Core Components/Risk Factors/Patient Goals Review:  Goals and Risk Factor Review    Row Name 01/31/18 1211 02/28/18 1240           Core Components/Risk Factors/Patient Goals Review   Personal Goals Review  Weight Management/Obesity;Improve shortness of breath  with ADL's;Hypertension;Lipids  Weight Management/Obesity;Improve shortness of breath with ADL's;Hypertension;Lipids      Review  Peachie has come down in her weight since she started the program. She takes medication for her blood pressure and it has been stable. Her last reading was 124/58. She states her breathing is not that great at home. Her breathing sometime feels short at home but she does not check her oxygen. She feels like her oxygen is fine but sometimes she feels more short of breath at home then in class. She has had her cholesterol checked recently and her cholesterol was high. Her doctor states that she may want to put her on cholesterol medication but is not sure yet.  Jawanna has met her goal weight of 150lb and has maintained it. She reports not eating too much because of her swallowing issues. She goes for a procedure this week to see if there is anything wrong and what they can do. She also reports that her SOB has been okay, but she has been coughing up some stuff. She is communicating this with her doctor, she is still flushing her sinuses with medication.  Her doctor decided not to put her on any cholesterol medication, and just focus on diet and exercise      Expected Outcomes  Short: change diet to help with cholesterol. Long: take medication for Lipids if need be and get lipids lower.  Short: Incoporate a low cholesterol diet daily. Long: continue exercising post graduation to help with SOB and cholesterol. Maintain weight.          Core Components/Risk Factors/Patient Goals at Discharge (Final Review):  Goals and Risk Factor Review - 02/28/18 1240      Core Components/Risk Factors/Patient Goals Review   Personal Goals  Review  Weight Management/Obesity;Improve shortness of breath with ADL's;Hypertension;Lipids    Review  Marieli has met her goal weight of 150lb and has maintained it. She reports not eating too much because of her swallowing issues. She goes for a procedure this  week to see if there is anything wrong and what they can do. She also reports that her SOB has been okay, but she has been coughing up some stuff. She is communicating this with her doctor, she is still flushing her sinuses with medication.  Her doctor decided not to put her on any cholesterol medication, and just focus on diet and exercise    Expected Outcomes  Short: Incoporate a low cholesterol diet daily. Long: continue exercising post graduation to help with SOB and cholesterol. Maintain weight.        ITP Comments: ITP Comments    Row Name 01/02/18 1105 01/08/18 0841 02/05/18 0831 03/05/18 1119     ITP Comments  Medical Evaluation completed. Chart sent for review and changes to Dr. Emily Filbert Director of [place]. Diagnosis can be found in [facility] encounter 12/15/17   30 day review completed. ITP sent to Dr. Emily Filbert Director of [place]. Continue with ITP unless changes are made by physician   30 day review completed. ITP sent to Dr. Emily Filbert Director of [place]. Continue with ITP unless changes are made by physician   30 day review completed. ITP sent to Dr. Emily Filbert Director of [place]. Continue with ITP unless changes are made by physician       Comments: 30 day review

## 2018-03-05 NOTE — Progress Notes (Signed)
Daily Session Note  Patient Details  Name: CHALEY CASTELLANOS MRN: [id number] Date of Birth: 1935/04/02 Referring Provider:     Pulmonary Rehab from 01/02/2018 in [facility] Cardiac and Pulmonary Rehab  Referring Provider  Liane Comber      Encounter Date: 03/05/2018  Check In: Session Check In - 03/05/18 1234      Check-In   Location  [facility]-Cardiac & Pulmonary Rehab    Staff Present  Justin Mend RCP,RRT,BSRT;Laureen Owens Shark, BS, RRT, Respiratory Therapist;Nana Addai, RN BSN    Supervising physician immediately available to respond to emergencies  [facility] immediately available ER MD    Physician(s)  Dr. Corky Downs and Jimmye Norman    Medication changes reported      No    Fall or balance concerns reported     No    Warm-up and Cool-down  Performed as group-led instruction    Resistance Training Performed  Yes    VAD Patient?  No      Pain Assessment   Currently in Pain?  No/denies          Social History   Tobacco Use  Smoking Status Never Smoker  Smokeless Tobacco Never Used    Goals Met:  Independence with exercise equipment Exercise tolerated well No report of cardiac concerns or symptoms Strength training completed today  Goals Unmet:  Not Applicable  Comments: Pt able to follow exercise prescription today without complaint.  Will continue to monitor for progression.   Dr. Emily Filbert is Medical Director for [facility] and [facility].

## 2018-03-09 ENCOUNTER — Encounter: Payer: Medicare Other | Admitting: *Deleted

## 2018-03-09 DIAGNOSIS — J449 Chronic obstructive pulmonary disease, unspecified: Secondary | ICD-10-CM

## 2018-03-09 DIAGNOSIS — J4540 Moderate persistent asthma, uncomplicated: Secondary | ICD-10-CM

## 2018-03-09 NOTE — Progress Notes (Signed)
Daily Session Note  Patient Details  Name: Kim Owen MRN: [id number] Date of Birth: February 26, 1935 Referring Provider:     Pulmonary Rehab from 01/02/2018 in [facility] Cardiac and Pulmonary Rehab  Referring Provider  Liane Comber      Encounter Date: 03/09/2018  Check In: Session Check In - 03/09/18 1121      Check-In   Location  [facility]-Cardiac & Pulmonary Rehab    Staff Present  Justin Mend Lorre Nick, [place], RCEP, CCRP, Exercise Physiologist;Meredith Sherryll Burger, RN BSN    Supervising physician immediately available to respond to emergencies  [facility] immediately available ER MD    Physician(s)  Dr. Cherylann Banas and Alfred Levins     Medication changes reported      No    Fall or balance concerns reported     No    Tobacco Cessation  No Change    Warm-up and Cool-down  Performed as group-led instruction    Resistance Training Performed  Yes    VAD Patient?  No    PAD/SET Patient?  No      Pain Assessment   Currently in Pain?  No/denies          Social History   Tobacco Use  Smoking Status Never Smoker  Smokeless Tobacco Never Used    Goals Met:  Proper associated with RPD/PD & O2 Sat Independence with exercise equipment Using PLB without cueing & demonstrates good technique Exercise tolerated well No report of cardiac concerns or symptoms Strength training completed today  Goals Unmet:  Not Applicable  Comments: Pt able to follow exercise prescription today without complaint.  Will continue to monitor for progression.    Dr. Emily Filbert is Medical Director for [place] and [facility].

## 2018-03-12 DIAGNOSIS — J449 Chronic obstructive pulmonary disease, unspecified: Secondary | ICD-10-CM

## 2018-03-12 DIAGNOSIS — J4540 Moderate persistent asthma, uncomplicated: Secondary | ICD-10-CM | POA: Diagnosis not present

## 2018-03-12 NOTE — Progress Notes (Signed)
Daily Session Note  Patient Details  Name: Kim Owen MRN: [id number] Date of Birth: 03-18-1935 Referring Provider:     Pulmonary Rehab from 01/02/2018 in [facility] Cardiac and Pulmonary Rehab  Referring Provider  Liane Comber      Encounter Date: 03/12/2018  Check In: Session Check In - 03/12/18 1129      Check-In   Location  [facility]-Cardiac & Pulmonary Rehab    Staff Present  Earlean Shawl, BS, ACSM CEP, Exercise Physiologist;Laureen Owens Shark, BS, RRT, Respiratory Therapist;Joseph [place] physician immediately available to respond to emergencies  [facility] immediately available ER MD    Physician(s)  Dr. Wynona Neat and Cinda Quest     Medication changes reported      No    Fall or balance concerns reported     No    Tobacco Cessation  No Change    Warm-up and Cool-down  Performed on first and last piece of equipment    Resistance Training Performed  Yes    VAD Patient?  No    PAD/SET Patient?  No      Pain Assessment   Currently in Pain?  No/denies    Multiple Pain Sites  No          Social History   Tobacco Use  Smoking Status Never Smoker  Smokeless Tobacco Never Used    Goals Met:  Proper associated with RPD/PD & O2 Sat Independence with exercise equipment Using PLB without cueing & demonstrates good technique Exercise tolerated well Strength training completed today  Goals Unmet:  Not Applicable  Comments: Pt able to follow exercise prescription today without complaint.  Will continue to monitor for progression.    Dr. Emily Filbert is Medical Director for [place] and [facility].

## 2018-03-14 VITALS — Ht 66.0 in | Wt 150.5 lb

## 2018-03-14 DIAGNOSIS — J4540 Moderate persistent asthma, uncomplicated: Secondary | ICD-10-CM | POA: Diagnosis not present

## 2018-03-14 DIAGNOSIS — J449 Chronic obstructive pulmonary disease, unspecified: Secondary | ICD-10-CM

## 2018-03-14 NOTE — Progress Notes (Signed)
Daily Session Note  Patient Details  Name: Kim Owen MRN: [id number] Date of Birth: 07/22/1935 Referring Provider:     Pulmonary Rehab from 01/02/2018 in [facility] Cardiac and Pulmonary Rehab  Referring Provider  [place]      Encounter Date: 03/14/2018  Check In: Session Check In - 03/14/18 1105      Check-In   Location  [facility]-Cardiac & Pulmonary Rehab    Staff Present  Joseph Hood RCP,RRT,BSRT;Nana Addai, RN BSN;Amanda Sommer, BA, ACSM CEP, Exercise Physiologist    Supervising physician immediately available to respond to emergencies  [facility] immediately available ER MD    Physician(s)  Dr. Goodman and [place]    Medication changes reported      No    Fall or balance concerns reported     No    Warm-up and Cool-down  Performed as group-led instruction    Resistance Training Performed  Yes    VAD Patient?  No      Pain Assessment   Currently in Pain?  No/denies          Social History   Tobacco Use  Smoking Status Never Smoker  Smokeless Tobacco Never Used    Goals Met:  Independence with exercise equipment Exercise tolerated well No report of cardiac concerns or symptoms Strength training completed today  Goals Unmet:  Not Applicable  Comments:  6 Minute Walk    Row Name 09/18/17 1610 01/02/18 1139 03/14/18 1135     6 Minute Walk   Phase  Initial  -  Discharge   Distance  930 feet  960 feet  1115 feet   Distance % Change  -  -  16 %   Distance Feet Change  -  -  155 ft   Walk Time  6 minutes  6 minutes  6 minutes   # of Rest Breaks  0  0  0   MPH  1.76  1.82  2.1   METS  1.98  2.04  2.49   RPE  11  16  12   Perceived Dyspnea   1  4  0   VO2 Peak  6.94  7.15  8.7   Symptoms  Yes (comment)  No  No   Comments  SOB, staggered gait  -  -   Resting HR  84 bpm  75 bpm  87 bpm   Resting BP  136/70  126/52  128/58   Resting Oxygen Saturation   98 %  100 %  99 %   Exercise Oxygen Saturation  during 6 min walk  98 %  98 %  98 %   Max Ex. HR  110 bpm   99 bpm  105 bpm   Max Ex. BP  134/68  160/60  170/62   2 Minute Post BP  132/64  132/60  -     Interval HR   1 Minute HR  103  90  95   2 Minute HR  107  94  101   3 Minute HR  110  -  99   4 Minute HR  107  91  103   5 Minute HR  106  96  104   6 Minute HR  106  99  105   2 Minute Post HR  93  84  89   Interval Heart Rate?  Yes  Yes  -     Interval Oxygen   Interval Oxygen?  Yes  Yes    Yes   Baseline Oxygen Saturation %  98 %  100 %  99 %   1 Minute Oxygen Saturation %  98 %  100 %  99 %   1 Minute Liters of Oxygen  0 L Room Air  0 L  0 L   2 Minute Oxygen Saturation %  98 %  99 %  98 %   2 Minute Liters of Oxygen  0 L  0 L  0 L   3 Minute Oxygen Saturation %  98 %  -  99 %   3 Minute Liters of Oxygen  0 L  0 L  0 L   4 Minute Oxygen Saturation %  98 %  100 %  99 %   4 Minute Liters of Oxygen  0 L  0 L  0 L   5 Minute Oxygen Saturation %  98 %  100 %  99 %   5 Minute Liters of Oxygen  0 L  0 L  0 L   6 Minute Oxygen Saturation %  99 %  99 %  99 %   6 Minute Liters of Oxygen  0 L  0 L  0 L   2 Minute Post Oxygen Saturation %  99 %  98 %  98 %   2 Minute Post Liters of Oxygen  0 L  0 L  0 L    Pt able to follow exercise prescription today without complaint.  Will continue to monitor for progression.   Dr. Mark Miller is Medical Director for [facility] and [facility]. 

## 2018-03-19 DIAGNOSIS — J4540 Moderate persistent asthma, uncomplicated: Secondary | ICD-10-CM | POA: Diagnosis not present

## 2018-03-19 DIAGNOSIS — J449 Chronic obstructive pulmonary disease, unspecified: Secondary | ICD-10-CM

## 2018-03-19 NOTE — Progress Notes (Signed)
Daily Session Note  Patient Details  Name: Kim Owen MRN: [id number] Date of Birth: 1935/06/27 Referring Provider:     Pulmonary Rehab from 01/02/2018 in [facility] Cardiac and Pulmonary Rehab  Referring Provider  Liane Comber      Encounter Date: 03/19/2018  Check In: Session Check In - 03/19/18 1137      Check-In   Location  [facility]-Cardiac & Pulmonary Rehab    Staff Present  Justin Mend RCP,RRT,BSRT;Amanda Oletta Darter, BA, ACSM CEP, Exercise Physiologist;Kelly Amedeo Plenty, BS, ACSM CEP, Exercise Physiologist    Supervising physician immediately available to respond to emergencies  [facility] immediately available ER MD    Physician(s)  Dr. Jimmye Norman and Corky Downs    Medication changes reported      No    Fall or balance concerns reported     No    Warm-up and Cool-down  Performed as group-led instruction    Resistance Training Performed  Yes    VAD Patient?  No      Pain Assessment   Currently in Pain?  No/denies          Social History   Tobacco Use  Smoking Status Never Smoker  Smokeless Tobacco Never Used    Goals Met:  Proper associated with RPD/PD & O2 Sat Independence with exercise equipment Improved SOB with ADL's Using PLB without cueing & demonstrates good technique Exercise tolerated well No report of cardiac concerns or symptoms Strength training completed today  Goals Unmet:  Not Applicable  Comments:  [name] graduated today from  rehab with 27 sessions completed.  Details of the patient's exercise prescription and what She needs to do in order to continue the prescription and progress were discussed with patient.  Patient was given a copy of prescription and goals.  Patient verbalized understanding.  [name] plans to continue to exercise by walking three times a week with a friend.   Dr. Emily Filbert is Medical Director for [place] and [facility].

## 2018-03-19 NOTE — Patient Instructions (Signed)
Discharge Patient Instructions  Patient Details  Name: Kim Owen MRN: [id number] Date of Birth: 1935-02-05 Referring Provider:  Thad Ranger, MD   Number of Visits: 27/36  Reason for Discharge:  Patient reached a stable level of exercise. Patient independent in their exercise. Patient has met program and personal goals. Early Exit:  Personal  Smoking History:  Social History   Tobacco Use  Smoking Status Never Smoker  Smokeless Tobacco Never Used    Diagnosis:  Chronic obstructive pulmonary disease, unspecified COPD type ([place])  Initial Exercise Prescription: Initial Exercise Prescription - 01/02/18 1100      Date of Initial Exercise RX and Referring Provider   Date  01/02/18    Referring Provider  Liane Comber      Treadmill   MPH  1.3    Grade  0.5    Minutes  15    METs  2.09      NuStep   Level  2    SPM  80    Minutes  15    METs  2      Arm Ergometer   Level  1    Watts  22    RPM  25    Minutes  15    METs  2      Biostep-RELP   Level  2    SPM  45    Minutes  15    METs  2      Prescription Details   Frequency (times per week)  3    Duration  Progress to 45 minutes of aerobic exercise without signs/symptoms of physical distress      Intensity   THRR 40-80% of Max Heartrate  101-126    Ratings of Perceived Exertion  11-13    Perceived Dyspnea  0-4      Resistance Training   Training Prescription  Yes    Weight  3 lb    Reps  10-15       Discharge Exercise Prescription (Final Exercise Prescription Changes): Exercise Prescription Changes - 03/06/18 1400      Response to Exercise   Blood Pressure (Admit)  134/56    Blood Pressure (Exit)  122/58    Heart Rate (Admit)  91 bpm    Heart Rate (Exercise)  102 bpm    Heart Rate (Exit)  73 bpm    Oxygen Saturation (Admit)  99 %    Oxygen Saturation (Exercise)  97 %    Oxygen Saturation (Exit)  98 %    Rating of Perceived Exertion (Exercise)  13    Perceived Dyspnea (Exercise)  3     Symptoms  none    Duration  Continue with 45 min of aerobic exercise without signs/symptoms of physical distress.    Intensity  THRR unchanged      Progression   Progression  Continue to progress workloads to maintain intensity without signs/symptoms of physical distress.    Average METs  2.67      Resistance Training   Training Prescription  Yes    Weight  4 lbs    Reps  10-15      Interval Training   Interval Training  No      Treadmill   MPH  2.3    Grade  0.5    Minutes  15    METs  2.92      NuStep   Level  4    Minutes  15    METs  2.1      Biostep-RELP   Level  5    SPM  44    Minutes  15    METs  3      Home Exercise Plan   Plans to continue exercise at  Home (comment) walk [facility] friend [facility]    Frequency  Add 3 additional days to program exercise sessions.    Initial Home Exercises Provided  01/05/18 [facility]       Functional Capacity: 6 Minute Walk    Row Name 01/02/18 1139 03/14/18 1135       6 Minute Walk   Phase  -  Discharge    Distance  960 feet  1115 feet    Distance % Change  -  16 %    Distance Feet Change  -  155 ft    Walk Time  6 minutes  6 minutes    # of Rest Breaks  0  0    MPH  1.82  2.1    METS  2.04  2.49    RPE  16  12    Perceived Dyspnea   4  0    VO2 Peak  7.15  8.7    Symptoms  No  No    Resting HR  75 bpm  87 bpm    Resting BP  126/52  128/58    Resting Oxygen Saturation   100 %  99 %    Exercise Oxygen Saturation  during 6 min walk  98 %  98 %    Max Ex. HR  99 bpm  105 bpm    Max Ex. BP  160/60  170/62    2 Minute Post BP  132/60  -      Interval HR   1 Minute HR  90  95    2 Minute HR  94  101    3 Minute HR  -  99    4 Minute HR  91  103    5 Minute HR  96  104    6 Minute HR  99  105    2 Minute Post HR  84  89    Interval Heart Rate?  Yes  -      Interval Oxygen   Interval Oxygen?  Yes  Yes    Baseline Oxygen Saturation %  100 %  99 %    1 Minute Oxygen Saturation %  100 %  99 %     1 Minute Liters of Oxygen  0 L  0 L    2 Minute Oxygen Saturation %  99 %  98 %    2 Minute Liters of Oxygen  0 L  0 L    3 Minute Oxygen Saturation %  -  99 %    3 Minute Liters of Oxygen  0 L  0 L    4 Minute Oxygen Saturation %  100 %  99 %    4 Minute Liters of Oxygen  0 L  0 L    5 Minute Oxygen Saturation %  100 %  99 %    5 Minute Liters of Oxygen  0 L  0 L    6 Minute Oxygen Saturation %  99 %  99 %    6 Minute Liters of Oxygen  0 L  0 L    2 Minute Post Oxygen Saturation %  98 %  98 %  2 Minute Post Liters of Oxygen  0 L  0 L       Quality of Life:   Personal Goals: Goals established at orientation with interventions provided to work toward goal. Personal Goals and Risk Factors at Admission - 01/02/18 1123      Core Components/Risk Factors/Patient Goals on Admission    Weight Management  Yes;Weight Loss    Intervention  Weight Management: Develop a combined nutrition and exercise program designed to reach desired caloric intake, while maintaining appropriate intake of nutrient and fiber, sodium and fats, and appropriate energy expenditure required for the weight goal.;Weight Management: Provide education and appropriate resources to help participant work on and attain dietary goals.;Weight Management/Obesity: Establish reasonable short term and long term weight goals.    Admit Weight  153 lb 9.6 oz (69.7 kg)    Goal Weight: Short Term  148 lb (67.1 kg)    Goal Weight: Long Term  148 lb (67.1 kg)    Expected Outcomes  Short Term: Continue to assess and modify interventions until short term weight is achieved;Long Term: Adherence to nutrition and physical activity/exercise program aimed toward attainment of established weight goal;Weight Loss: Understanding of general recommendations for a balanced deficit meal plan, which promotes 1-2 lb weight loss per week and includes a negative energy balance of [phone] kcal/d;Understanding recommendations for meals to include 15-35%  energy as protein, 25-35% energy from fat, 35-60% energy from carbohydrates, less than 29m of dietary cholesterol, 20-35 gm of total fiber daily;Understanding of distribution of calorie intake throughout the day with the consumption of 4-5 meals/snacks;Weight Maintenance: Understanding of the daily nutrition guidelines, which includes 25-35% calories from fat, 7% or less cal from saturated fats, less than 2078mcholesterol, less than 1.5gm of sodium, & 5 or more servings of fruits and vegetables daily    Improve shortness of breath with ADL's  Yes    Intervention  Provide education, individualized exercise plan and daily activity instruction to help decrease symptoms of SOB with activities of daily living.    Expected Outcomes  Short Term: Improve cardiorespiratory fitness to achieve a reduction of symptoms when performing ADLs;Long Term: Be able to perform more ADLs without symptoms or delay the onset of symptoms    Hypertension  Yes    Intervention  Provide education on lifestyle modifcations including regular physical activity/exercise, weight management, moderate sodium restriction and increased consumption of fresh fruit, vegetables, and low fat dairy, alcohol moderation, and smoking cessation.;Monitor prescription use compliance.    Expected Outcomes  Short Term: Continued assessment and intervention until BP is < 140/9071mG in hypertensive participants. < 130/38m76m in hypertensive participants with diabetes, heart failure or chronic kidney disease.;Long Term: Maintenance of blood pressure at goal levels.    Lipids  Yes    Intervention  Provide education and support for participant on nutrition & aerobic/resistive exercise along with prescribed medications to achieve LDL <70mg100mL >40mg.47mExpected Outcomes  Short Term: Participant states understanding of desired cholesterol values and is compliant with medications prescribed. Participant is following exercise prescription and nutrition  guidelines.;Long Term: Cholesterol controlled with medications as prescribed, with individualized exercise RX and with personalized nutrition plan. Value goals: LDL < 70mg, 38m> 40 mg.        Personal Goals Discharge: Goals and Risk Factor Review - 02/28/18 1240      Core Components/Risk Factors/Patient Goals Review   Personal Goals Review  Weight Management/Obesity;Improve shortness of breath with ADL's;Hypertension;Lipids  Review  Kim Owen has met her goal weight of 150lb and has maintained it. She reports not eating too much because of her swallowing issues. She goes for a procedure this week to see if there is anything wrong and what they can do. She also reports that her SOB has been okay, but she has been coughing up some stuff. She is communicating this with her doctor, she is still flushing her sinuses with medication.  Her doctor decided not to put her on any cholesterol medication, and just focus on diet and exercise    Expected Outcomes  Short: Incoporate a low cholesterol diet daily. Long: continue exercising post graduation to help with SOB and cholesterol. Maintain weight.        Exercise Goals and Review: Exercise Goals    Row Name 01/02/18 1139             Exercise Goals   Increase Physical Activity  Yes       Intervention  Provide advice, education, support and counseling about physical activity/exercise needs.;Develop an individualized exercise prescription for aerobic and resistive training based on initial evaluation findings, risk stratification, comorbidities and participant's personal goals.       Expected Outcomes  Short Term: Attend rehab on a regular basis to increase amount of physical activity.;Long Term: Add in home exercise to make exercise part of routine and to increase amount of physical activity.;Long Term: Exercising regularly at least 3-5 days a week.       Increase Strength and Stamina  Yes       Intervention  Provide advice, education, support and  counseling about physical activity/exercise needs.;Develop an individualized exercise prescription for aerobic and resistive training based on initial evaluation findings, risk stratification, comorbidities and participant's personal goals.       Expected Outcomes  Short Term: Increase workloads from initial exercise prescription for resistance, speed, and METs.;Short Term: Perform resistance training exercises routinely during rehab and add in resistance training at home;Long Term: Improve cardiorespiratory fitness, muscular endurance and strength as measured by increased METs and functional capacity (6MWT)       Able to understand and use rate of perceived exertion (RPE) scale  Yes       Intervention  Provide education and explanation on how to use RPE scale       Expected Outcomes  Short Term: Able to use RPE daily in rehab to express subjective intensity level;Long Term:  Able to use RPE to guide intensity level when exercising independently       Able to understand and use Dyspnea scale  Yes       Intervention  Provide education and explanation on how to use Dyspnea scale       Expected Outcomes  Short Term: Able to use Dyspnea scale daily in rehab to express subjective sense of shortness of breath during exertion;Long Term: Able to use Dyspnea scale to guide intensity level when exercising independently       Knowledge and understanding of Target Heart Rate Range (THRR)  Yes       Intervention  Provide education and explanation of THRR including how the numbers were predicted and where they are located for reference       Expected Outcomes  Short Term: Able to state/look up THRR;Short Term: Able to use daily as guideline for intensity in rehab;Long Term: Able to use THRR to govern intensity when exercising independently       Able to check pulse independently  Yes  Intervention  Provide education and demonstration on how to check pulse in carotid and radial arteries.;Review the importance of  being able to check your own pulse for safety during independent exercise       Expected Outcomes  Short Term: Able to explain why pulse checking is important during independent exercise;Long Term: Able to check pulse independently and accurately       Understanding of Exercise Prescription  Yes       Intervention  Provide education, explanation, and written materials on patient's individual exercise prescription       Expected Outcomes  Short Term: Able to explain program exercise prescription;Long Term: Able to explain home exercise prescription to exercise independently          Nutrition & Weight - Outcomes: Pre Biometrics - 01/02/18 1138      Pre Biometrics   Height  '5\' 6"'  (1.676 m)    Weight  153 lb 9.6 oz (69.7 kg)    Waist Circumference  36.25 inches    Hip Circumference  39 inches    Waist to Hip Ratio  0.93 %    BMI (Calculated)  24.8      Post Biometrics - 03/14/18 1134       Post  Biometrics   Height  '5\' 6"'  (1.676 m)    Weight  150 lb 8 oz (68.3 kg)    Waist Circumference  36 inches    Hip Circumference  39 inches    Waist to Hip Ratio  0.92 %    BMI (Calculated)  24.3       Nutrition: Nutrition Therapy & Goals - 01/02/18 1115      Personal Nutrition Goals   Nutrition Goal  Learn to eat better and lose some weight.    Comments  She eats lot of vegetables and wants to learn to eat better.      Intervention Plan   Intervention  Prescribe, educate and counsel regarding individualized specific dietary modifications aiming towards targeted core components such as weight, hypertension, lipid management, diabetes, heart failure and other comorbidities.    Expected Outcomes  Short Term Goal: Understand basic principles of dietary content, such as calories, fat, sodium, cholesterol and nutrients.;Long Term Goal: Adherence to prescribed nutrition plan.       Nutrition Discharge: Nutrition Assessments - 01/02/18 1115      MEDFICTS Scores   Pre Score  27        Education Questionnaire Score: Knowledge Questionnaire Score - 03/14/18 1145      Knowledge Questionnaire Score   Pre Score  13/18    Post Score  17/18 REVIEWED with patient       Goals reviewed with patient; copy given to patient.

## 2018-03-19 NOTE — Progress Notes (Signed)
Discharge Progress Report  Patient Details  Name: Kim Owen MRN: [id number] Date of Birth: 30-Jun-1935 Referring Provider:     Pulmonary Rehab from 01/02/2018 in [facility]  Referring Provider  Liane Comber       Number of Visits: 27/36  Reason for Discharge:  Patient reached a stable level of exercise. Patient independent in their exercise. Patient has met program and personal goals. Early Exit:  Personal  Smoking History:  Social History   Tobacco Use  Smoking Status Never Smoker  Smokeless Tobacco Never Used    Diagnosis:  Chronic obstructive pulmonary disease, unspecified COPD type ([place])  ADL [facility]: Pulmonary Assessment Scores    Row Name 01/02/18 1116 03/14/18 1144       ADL [facility]   ADL Phase  Entry  Exit    SOB Score total  63  45    Rest  0  1    Walk  3  1    Stairs  4  3    Bath  2  0    Dress  2  1    Shop  2  2      CAT Score   CAT Score  26  17      mMRC Score   mMRC Score  3  2       Initial Exercise Prescription: Initial Exercise Prescription - 01/02/18 1100      Date of Initial Exercise RX and Referring Provider   Date  01/02/18    Referring Provider  Liane Comber      Treadmill   MPH  1.3    Grade  0.5    Minutes  15    METs  2.09      NuStep   Level  2    SPM  80    Minutes  15    METs  2      Arm Ergometer   Level  1    Watts  22    RPM  25    Minutes  15    METs  2      Biostep-RELP   Level  2    SPM  45    Minutes  15    METs  2      Prescription Details   Frequency (times per week)  3    Duration  Progress to 45 minutes of aerobic exercise without signs/symptoms of physical distress      Intensity   THRR 40-80% of Max Heartrate  101-126    Ratings of Perceived Exertion  11-13    Perceived Dyspnea  0-4      Resistance Training   Training Prescription  Yes    Weight  3 lb    Reps  10-15       Discharge Exercise Prescription (Final Exercise Prescription Changes): Exercise  Prescription Changes - 03/06/18 1400      Response to Exercise   Blood Pressure (Admit)  134/56    Blood Pressure (Exit)  122/58    Heart Rate (Admit)  91 bpm    Heart Rate (Exercise)  102 bpm    Heart Rate (Exit)  73 bpm    Oxygen Saturation (Admit)  99 %    Oxygen Saturation (Exercise)  97 %    Oxygen Saturation (Exit)  98 %    Rating of Perceived Exertion (Exercise)  13    Perceived Dyspnea (Exercise)  3    Symptoms  none    Duration  Continue with 45 min of aerobic exercise without signs/symptoms of physical distress.    Intensity  THRR unchanged      Progression   Progression  Continue to progress workloads to maintain intensity without signs/symptoms of physical distress.    Average METs  2.67      Resistance Training   Training Prescription  Yes    Weight  4 lbs    Reps  10-15      Interval Training   Interval Training  No      Treadmill   MPH  2.3    Grade  0.5    Minutes  15    METs  2.92      NuStep   Level  4    Minutes  15    METs  2.1      Biostep-RELP   Level  5    SPM  44    Minutes  15    METs  3      Home Exercise Plan   Plans to continue exercise at  Home (comment) walk with friend at [facility]    Frequency  Add 3 additional days to program exercise sessions.    Initial Home Exercises Provided  01/05/18 [facility]       Functional Capacity: 6 Minute Walk    Row Name 01/02/18 1139 03/14/18 1135       6 Minute Walk   Phase  -  Discharge    Distance  960 feet  1115 feet    Distance % Change  -  16 %    Distance Feet Change  -  155 ft    Walk Time  6 minutes  6 minutes    # of Rest Breaks  0  0    MPH  1.82  2.1    METS  2.04  2.49    RPE  16  12    Perceived Dyspnea   4  0    VO2 Peak  7.15  8.7    Symptoms  No  No    Resting HR  75 bpm  87 bpm    Resting BP  126/52  128/58    Resting Oxygen Saturation   100 %  99 %    Exercise Oxygen Saturation  during 6 min walk  98 %  98 %    Max Ex. HR  99 bpm  105 bpm    Max Ex. BP   160/60  170/62    2 Minute Post BP  132/60  -      Interval HR   1 Minute HR  90  95    2 Minute HR  94  101    3 Minute HR  -  99    4 Minute HR  91  103    5 Minute HR  96  104    6 Minute HR  99  105    2 Minute Post HR  84  89    Interval Heart Rate?  Yes  -      Interval Oxygen   Interval Oxygen?  Yes  Yes    Baseline Oxygen Saturation %  100 %  99 %    1 Minute Oxygen Saturation %  100 %  99 %    1 Minute Liters of Oxygen  0 L  0 L    2 Minute Oxygen Saturation %  99 %  98 %    2 Minute Liters of Oxygen  0 L  0 L    3 Minute Oxygen Saturation %  -  99 %    3 Minute Liters of Oxygen  0 L  0 L    4 Minute Oxygen Saturation %  100 %  99 %    4 Minute Liters of Oxygen  0 L  0 L    5 Minute Oxygen Saturation %  100 %  99 %    5 Minute Liters of Oxygen  0 L  0 L    6 Minute Oxygen Saturation %  99 %  99 %    6 Minute Liters of Oxygen  0 L  0 L    2 Minute Post Oxygen Saturation %  98 %  98 %    2 Minute Post Liters of Oxygen  0 L  0 L       Psychological, QOL, Others - Outcomes: PHQ 2/9: Depression screen [facility] 2/9 03/14/2018 01/02/2018 09/18/2017  Decreased Interest 0 2 3  Down, Depressed, Hopeless 0 1 2  PHQ - 2 Score 0 3 5  Altered sleeping 1 0 2  Tired, decreased energy '1 1 3  ' Change in appetite 1 0 0  Feeling bad or failure about yourself  0 1 1  Trouble concentrating 0 1 1  Moving slowly or fidgety/restless 0 0 1  Suicidal thoughts 0 0 0  PHQ-9 Score '3 6 13  ' Difficult doing work/chores Not difficult at all Somewhat difficult Somewhat difficult    Quality of Life:   Personal Goals: Goals established at orientation with interventions provided to work toward goal. Personal Goals and Risk Factors at Admission - 01/02/18 1123      Core Components/Risk Factors/Patient Goals on Admission    Weight Management  Yes;Weight Loss    Intervention  Weight Management: Develop a combined nutrition and exercise program designed to reach desired caloric intake, while  maintaining appropriate intake of nutrient and fiber, sodium and fats, and appropriate energy expenditure required for the weight goal.;Weight Management: Provide education and appropriate resources to help participant work on and attain dietary goals.;Weight Management/Obesity: Establish reasonable short term and long term weight goals.    Admit Weight  153 lb 9.6 oz (69.7 kg)    Goal Weight: Short Term  148 lb (67.1 kg)    Goal Weight: Long Term  148 lb (67.1 kg)    Expected Outcomes  Short Term: Continue to assess and modify interventions until short term weight is achieved;Long Term: Adherence to nutrition and physical activity/exercise program aimed toward attainment of established weight goal;Weight Loss: Understanding of general recommendations for a balanced deficit meal plan, which promotes 1-2 lb weight loss per week and includes a negative energy balance of [phone] kcal/d;Understanding recommendations for meals to include 15-35% energy as protein, 25-35% energy from fat, 35-60% energy from carbohydrates, less than 278m of dietary cholesterol, 20-35 gm of total fiber daily;Understanding of distribution of calorie intake throughout the day with the consumption of 4-5 meals/snacks;Weight Maintenance: Understanding of the daily nutrition guidelines, which includes 25-35% calories from fat, 7% or less cal from saturated fats, less than 2042mcholesterol, less than 1.5gm of sodium, & 5 or more servings of fruits and vegetables daily    Improve shortness of breath with ADL's  Yes    Intervention  Provide education, individualized exercise plan and daily activity instruction to help decrease symptoms of SOB with activities of daily living.  Expected Outcomes  Short Term: Improve cardiorespiratory fitness to achieve a reduction of symptoms when performing ADLs;Long Term: Be able to perform more ADLs without symptoms or delay the onset of symptoms    Hypertension  Yes    Intervention  Provide education  on lifestyle modifcations including regular physical activity/exercise, weight management, moderate sodium restriction and increased consumption of fresh fruit, vegetables, and low fat dairy, alcohol moderation, and smoking cessation.;Monitor prescription use compliance.    Expected Outcomes  Short Term: Continued assessment and intervention until BP is < 140/19m HG in hypertensive participants. < 130/84mHG in hypertensive participants with diabetes, heart failure or chronic kidney disease.;Long Term: Maintenance of blood pressure at goal levels.    Lipids  Yes    Intervention  Provide education and support for participant on nutrition & aerobic/resistive exercise along with prescribed medications to achieve LDL <7020mHDL >7m20m  Expected Outcomes  Short Term: Participant states understanding of desired cholesterol values and is compliant with medications prescribed. Participant is following exercise prescription and nutrition guidelines.;Long Term: Cholesterol controlled with medications as prescribed, with individualized exercise RX and with personalized nutrition plan. Value goals: LDL < 70mg81mL > 40 mg.        Personal Goals Discharge: Goals and Risk Factor Review    Row Name 01/31/18 1211 02/28/18 1240           Core Components/Risk Factors/Patient Goals Review   Personal Goals Review  Weight Management/Obesity;Improve shortness of breath with ADL's;Hypertension;Lipids  Weight Management/Obesity;Improve shortness of breath with ADL's;Hypertension;Lipids      Review  Bernadene has come down in her weight since she started the program. She takes medication for her blood pressure and it has been stable. Her last reading was 124/58. She states her breathing is not that great at home. Her breathing sometime feels short at home but she does not check her oxygen. She feels like her oxygen is fine but sometimes she feels more short of breath at home then in class. She has had her cholesterol  checked recently and her cholesterol was high. Her doctor states that she may want to put her on cholesterol medication but is not sure yet.  Tonette has met her goal weight of 150lb and has maintained it. She reports not eating too much because of her swallowing issues. She goes for a procedure this week to see if there is anything wrong and what they can do. She also reports that her SOB has been okay, but she has been coughing up some stuff. She is communicating this with her doctor, she is still flushing her sinuses with medication.  Her doctor decided not to put her on any cholesterol medication, and just focus on diet and exercise      Expected Outcomes  Short: change diet to help with cholesterol. Long: take medication for Lipids if need be and get lipids lower.  Short: Incoporate a low cholesterol diet daily. Long: continue exercising post graduation to help with SOB and cholesterol. Maintain weight.          Exercise Goals and Review: Exercise Goals    Row Name 01/02/18 1139             Exercise Goals   Increase Physical Activity  Yes       Intervention  Provide advice, education, support and counseling about physical activity/exercise needs.;Develop an individualized exercise prescription for aerobic and resistive training based on initial evaluation findings, risk stratification, comorbidities and participant's personal  goals.       Expected Outcomes  Short Term: Attend rehab on a regular basis to increase amount of physical activity.;Long Term: Add in home exercise to make exercise part of routine and to increase amount of physical activity.;Long Term: Exercising regularly at least 3-5 days a week.       Increase Strength and Stamina  Yes       Intervention  Provide advice, education, support and counseling about physical activity/exercise needs.;Develop an individualized exercise prescription for aerobic and resistive training based on initial evaluation findings, risk stratification,  comorbidities and participant's personal goals.       Expected Outcomes  Short Term: Increase workloads from initial exercise prescription for resistance, speed, and METs.;Short Term: Perform resistance training exercises routinely during rehab and add in resistance training at home;Long Term: Improve cardiorespiratory fitness, muscular endurance and strength as measured by increased METs and functional capacity (6MWT)       Able to understand and use rate of perceived exertion (RPE) scale  Yes       Intervention  Provide education and explanation on how to use RPE scale       Expected Outcomes  Short Term: Able to use RPE daily in rehab to express subjective intensity level;Long Term:  Able to use RPE to guide intensity level when exercising independently       Able to understand and use Dyspnea scale  Yes       Intervention  Provide education and explanation on how to use Dyspnea scale       Expected Outcomes  Short Term: Able to use Dyspnea scale daily in rehab to express subjective sense of shortness of breath during exertion;Long Term: Able to use Dyspnea scale to guide intensity level when exercising independently       Knowledge and understanding of Target Heart Rate Range (THRR)  Yes       Intervention  Provide education and explanation of THRR including how the numbers were predicted and where they are located for reference       Expected Outcomes  Short Term: Able to state/look up THRR;Short Term: Able to use daily as guideline for intensity in rehab;Long Term: Able to use THRR to govern intensity when exercising independently       Able to check pulse independently  Yes       Intervention  Provide education and demonstration on how to check pulse in carotid and radial arteries.;Review the importance of being able to check your own pulse for safety during independent exercise       Expected Outcomes  Short Term: Able to explain why pulse checking is important during independent exercise;Long  Term: Able to check pulse independently and accurately       Understanding of Exercise Prescription  Yes       Intervention  Provide education, explanation, and written materials on patient's individual exercise prescription       Expected Outcomes  Short Term: Able to explain program exercise prescription;Long Term: Able to explain home exercise prescription to exercise independently          Nutrition & Weight - Outcomes: Pre Biometrics - 01/02/18 1138      Pre Biometrics   Height  '5\' 6"'  (1.676 m)    Weight  153 lb 9.6 oz (69.7 kg)    Waist Circumference  36.25 inches    Hip Circumference  39 inches    Waist to Hip Ratio  0.93 %  BMI (Calculated)  24.8      Post Biometrics - 03/14/18 1134       Post  Biometrics   Height  '5\' 6"'  (1.676 m)    Weight  150 lb 8 oz (68.3 kg)    Waist Circumference  36 inches    Hip Circumference  39 inches    Waist to Hip Ratio  0.92 %    BMI (Calculated)  24.3       Nutrition: Nutrition Therapy & Goals - 01/02/18 1115      Personal Nutrition Goals   Nutrition Goal  Learn to eat better and lose some weight.    Comments  She eats lot of vegetables and wants to learn to eat better.      Intervention Plan   Intervention  Prescribe, educate and counsel regarding individualized specific dietary modifications aiming towards targeted core components such as weight, hypertension, lipid management, diabetes, heart failure and other comorbidities.    Expected Outcomes  Short Term Goal: Understand basic principles of dietary content, such as calories, fat, sodium, cholesterol and nutrients.;Long Term Goal: Adherence to prescribed nutrition plan.       Nutrition Discharge: Nutrition Assessments - 01/02/18 1115      MEDFICTS Scores   Pre Score  27       Education Questionnaire Score: Knowledge Questionnaire Score - 03/14/18 1145      Knowledge Questionnaire Score   Pre Score  13/18    Post Score  17/18 REVIEWED with patient       Goals  reviewed with patient; copy given to patient.

## 2018-03-19 NOTE — Progress Notes (Signed)
Pulmonary Individual Treatment Plan  Patient Details  Name: Kim Owen MRN: [id number] Date of Birth: [birth year] Referring Provider:     Pulmonary Rehab from 01/02/2018 in [facility] Cardiac and Pulmonary Rehab  Referring Provider  Liane Comber      Initial Encounter Date:    Pulmonary Rehab from 01/02/2018 in [facility] Cardiac and Pulmonary Rehab  Date  01/02/18      Visit Diagnosis: Chronic obstructive pulmonary disease, unspecified COPD type ([place])  Patient's Home Medications on Admission:  Current Outpatient Medications:  .  albuterol (PROVENTIL HFA;VENTOLIN HFA) 108 (90 Base) MCG/ACT inhaler, Inhale 2 puffs into the lungs every 6 (six) hours as needed for wheezing or shortness of breath., Disp: , Rfl:  .  amLODipine (NORVASC) 5 MG tablet, Take 5 mg by mouth daily., Disp: , Rfl:  .  azelastine (ASTELIN) 0.1 % nasal spray, Place 2 sprays into both nostrils 2 (two) times daily. Use in each nostril as directed, Disp: , Rfl:  .  diclofenac sodium (VOLTAREN) 1 % GEL, Apply 2 g topically 4 (four) times daily., Disp: , Rfl:  .  esomeprazole (NEXIUM) 40 MG capsule, Take 40 mg by mouth daily at 12 noon., Disp: , Rfl:  .  Fluticasone-Salmeterol (ADVAIR) 250-50 MCG/DOSE AEPB, Inhale 1 puff into the lungs 2 (two) times daily., Disp: , Rfl:  .  HYDROcodone-acetaminophen (NORCO/VICODIN) 5-325 MG tablet, 1-2 tabs po qd prn severe pain, Disp: 6 tablet, Rfl: 0 .  levothyroxine (SYNTHROID, LEVOTHROID) 25 MCG tablet, Take 25 mcg by mouth daily before breakfast., Disp: , Rfl:  .  MOMETASONE FUROATE NA, Place into the nose., Disp: , Rfl:  .  montelukast (SINGULAIR) 10 MG tablet, Take 10 mg by mouth at bedtime., Disp: , Rfl:  .  predniSONE (DELTASONE) 20 MG tablet, 2 tabs po qd for 2 days, then 1 tab po qd for 2 days, then half at tab po qd for 2 days, Disp: 7 tablet, Rfl: 0 .  SUMAtriptan (IMITREX) 50 MG tablet, Take 50 mg by mouth every 2 (two) hours as needed for migraine. May repeat in 2 hours if headache  persists or recurs., Disp: , Rfl:  .  tiotropium (SPIRIVA) 18 MCG inhalation capsule, Place 18 mcg into inhaler and inhale daily., Disp: , Rfl:  .  topiramate (TOPAMAX) 50 MG tablet, Take 50 mg by mouth 2 (two) times daily., Disp: , Rfl:   Past Medical History: Past Medical History:  Diagnosis Date  . Asthma   . Barrett esophagus   . Cataract   . Colitis   . Fatty liver   . GERD (gastroesophageal reflux disease)   . Herpes zoster   . Hypertension   . PVD (peripheral vascular disease) ([place])     Tobacco Use: Social History   Tobacco Use  Smoking Status Never Smoker  Smokeless Tobacco Never Used    Labs: Recent Review Flowsheet Data    There is no flowsheet data to display.       Pulmonary Assessment Scores: Pulmonary Assessment Scores    Row Name 01/02/18 1116 03/14/18 1144       ADL UCSD   ADL Phase  Entry  Exit    SOB Score total  63  45    Rest  0  1    Walk  3  1    Stairs  4  3    Bath  2  0    Dress  2  1    Shop  2  2  CAT Score   CAT Score  26  17      mMRC Score   mMRC Score  3  2       Pulmonary Function Assessment: Pulmonary Function Assessment - 01/02/18 1108      Pulmonary Function Tests   FVC%  116.9 % PFT done on 01/13/17    FEV1%  109.8 %    FEV1/FVC Ratio  70.01      Breath   Bilateral Breath Sounds  Clear    Shortness of Breath  Limiting activity;Yes       Exercise Target Goals:    Exercise Program Goal: Individual exercise prescription set using results from initial 6 min walk test and THRR while considering  patient's activity barriers and safety.    Exercise Prescription Goal: Initial exercise prescription builds to 30-45 minutes a day of aerobic activity, 2-3 days per week.  Home exercise guidelines will be given to patient during program as part of exercise prescription that the participant will acknowledge.  Activity Barriers & Risk Stratification:   6 Minute Walk: 6 Minute Walk    Row Name 01/02/18 1139  03/14/18 1135       6 Minute Walk   Phase  -  Discharge    Distance  960 feet  1115 feet    Distance % Change  -  16 %    Distance Feet Change  -  155 ft    Walk Time  6 minutes  6 minutes    # of Rest Breaks  0  0    MPH  1.82  2.1    METS  2.04  2.49    RPE  16  12    Perceived Dyspnea   4  0    VO2 Peak  7.15  8.7    Symptoms  No  No    Resting HR  75 bpm  87 bpm    Resting BP  126/52  128/58    Resting Oxygen Saturation   100 %  99 %    Exercise Oxygen Saturation  during 6 min walk  98 %  98 %    Max Ex. HR  99 bpm  105 bpm    Max Ex. BP  160/60  170/62    2 Minute Post BP  132/60  -      Interval HR   1 Minute HR  90  95    2 Minute HR  94  101    3 Minute HR  -  99    4 Minute HR  91  103    5 Minute HR  96  104    6 Minute HR  99  105    2 Minute Post HR  84  89    Interval Heart Rate?  Yes  -      Interval Oxygen   Interval Oxygen?  Yes  Yes    Baseline Oxygen Saturation %  100 %  99 %    1 Minute Oxygen Saturation %  100 %  99 %    1 Minute Liters of Oxygen  0 L  0 L    2 Minute Oxygen Saturation %  99 %  98 %    2 Minute Liters of Oxygen  0 L  0 L    3 Minute Oxygen Saturation %  -  99 %    3 Minute Liters of Oxygen  0 L  0 L  4 Minute Oxygen Saturation %  100 %  99 %    4 Minute Liters of Oxygen  0 L  0 L    5 Minute Oxygen Saturation %  100 %  99 %    5 Minute Liters of Oxygen  0 L  0 L    6 Minute Oxygen Saturation %  99 %  99 %    6 Minute Liters of Oxygen  0 L  0 L    2 Minute Post Oxygen Saturation %  98 %  98 %    2 Minute Post Liters of Oxygen  0 L  0 L      Oxygen Initial Assessment: Oxygen Initial Assessment - 01/02/18 1122      Home Oxygen   Home Oxygen Device  None    Sleep Oxygen Prescription  None    Home Exercise Oxygen Prescription  None    Home at Rest Exercise Oxygen Prescription  None      Initial 6 min Walk   Oxygen Used  None      Program Oxygen Prescription   Program Oxygen Prescription  None      Intervention    Short Term Goals  To learn and demonstrate proper use of respiratory medications;To learn and demonstrate proper pursed lip breathing techniques or other breathing techniques.;To learn and understand importance of maintaining oxygen saturations>88%;To learn and understand importance of monitoring SPO2 with pulse oximeter and demonstrate accurate use of the pulse oximeter.    Long  Term Goals  Demonstrates proper use of MDI's;Compliance with respiratory medication;Exhibits proper breathing techniques, such as pursed lip breathing or other method taught during program session;Maintenance of O2 saturations>88%;Verbalizes importance of monitoring SPO2 with pulse oximeter and return demonstration       Oxygen Re-Evaluation: Oxygen Re-Evaluation    Row Name 09/25/17 1138 01/03/18 1117 01/31/18 1218 02/28/18 1248       Program Oxygen Prescription   Program Oxygen Prescription  -  None  None  None      Home Oxygen   Home Oxygen Device  -  None  None  None    Sleep Oxygen Prescription  -  None  None  None    Home Exercise Oxygen Prescription  -  None  None  None    Home at Rest Exercise Oxygen Prescription  -  None  None  None    Compliance with Home Oxygen Use  -  -  -  No      Goals/Expected Outcomes   Short Term Goals  To learn and demonstrate proper use of respiratory medications;To learn and demonstrate proper pursed lip breathing techniques or other breathing techniques.;To learn and understand importance of maintaining oxygen saturations>88%;To learn and understand importance of monitoring SPO2 with pulse oximeter and demonstrate accurate use of the pulse oximeter.  To learn and demonstrate proper use of respiratory medications;To learn and demonstrate proper pursed lip breathing techniques or other breathing techniques.;To learn and understand importance of maintaining oxygen saturations>88%;To learn and understand importance of monitoring SPO2 with pulse oximeter and demonstrate accurate use  of the pulse oximeter.  To learn and demonstrate proper use of respiratory medications;To learn and demonstrate proper pursed lip breathing techniques or other breathing techniques.;To learn and understand importance of maintaining oxygen saturations>88%;To learn and understand importance of monitoring SPO2 with pulse oximeter and demonstrate accurate use of the pulse oximeter.  To learn and demonstrate proper use of respiratory medications;To learn and demonstrate proper pursed  lip breathing techniques or other breathing techniques.;To learn and understand importance of maintaining oxygen saturations>88%;To learn and understand importance of monitoring SPO2 with pulse oximeter and demonstrate accurate use of the pulse oximeter.    Long  Term Goals  Demonstrates proper use of MDI's;Compliance with respiratory medication;Exhibits proper breathing techniques, such as pursed lip breathing or other method taught during program session;Maintenance of O2 saturations>88%;Verbalizes importance of monitoring SPO2 with pulse oximeter and return demonstration  Demonstrates proper use of MDI's;Compliance with respiratory medication;Exhibits proper breathing techniques, such as pursed lip breathing or other method taught during program session;Maintenance of O2 saturations>88%;Verbalizes importance of monitoring SPO2 with pulse oximeter and return demonstration  Demonstrates proper use of MDI's;Compliance with respiratory medication;Exhibits proper breathing techniques, such as pursed lip breathing or other method taught during program session;Maintenance of O2 saturations>88%;Verbalizes importance of monitoring SPO2 with pulse oximeter and return demonstration  Demonstrates proper use of MDI's;Compliance with respiratory medication;Exhibits proper breathing techniques, such as pursed lip breathing or other method taught during program session;Maintenance of O2 saturations>88%;Verbalizes importance of monitoring SPO2 with  pulse oximeter and return demonstration    Comments  Reviewed PLB technique with pt.  Talked about how it work and it's important to maintaining his exercise saturations.    Reviewed PLB technique with pt.  Talked about how it work and it's important to maintaining his exercise saturations.    Kim Owen started using the Advair inhaler twice a day. She has albuterol for a rescue inhaler if she needs it. Her oxygen has been in the high 90's when exercising. She uses PLB in class but needs to use it more when she is at home.  Kim Owen is using her medications properly. She has not needed to use her rescue inhaler. She can't tell a huge difference with her SOB at home, but is trying to practice PLB more.     Goals/Expected Outcomes  Short: Become more profiecient at using PLB.   Long: Become independent at using PLB.  Short: Become more profiecient at using PLB.   Long: Become independent at using PLB.  Short: use PLB at home for shortness of breath. Long: use PLB at home independently  Short: become independent with PLB at home. Long: maintain independence with PLB and respiratory medication use.       Oxygen Discharge (Final Oxygen Re-Evaluation): Oxygen Re-Evaluation - 02/28/18 1248      Program Oxygen Prescription   Program Oxygen Prescription  None      Home Oxygen   Home Oxygen Device  None    Sleep Oxygen Prescription  None    Home Exercise Oxygen Prescription  None    Home at Rest Exercise Oxygen Prescription  None    Compliance with Home Oxygen Use  No      Goals/Expected Outcomes   Short Term Goals  To learn and demonstrate proper use of respiratory medications;To learn and demonstrate proper pursed lip breathing techniques or other breathing techniques.;To learn and understand importance of maintaining oxygen saturations>88%;To learn and understand importance of monitoring SPO2 with pulse oximeter and demonstrate accurate use of the pulse oximeter.    Long  Term Goals  Demonstrates proper  use of MDI's;Compliance with respiratory medication;Exhibits proper breathing techniques, such as pursed lip breathing or other method taught during program session;Maintenance of O2 saturations>88%;Verbalizes importance of monitoring SPO2 with pulse oximeter and return demonstration    Comments  Kim Owen is using her medications properly. She has not needed to use her rescue inhaler. She can't tell  a huge difference with her SOB at home, but is trying to practice PLB more.     Goals/Expected Outcomes  Short: become independent with PLB at home. Long: maintain independence with PLB and respiratory medication use.       Initial Exercise Prescription: Initial Exercise Prescription - 01/02/18 1100      Date of Initial Exercise RX and Referring Provider   Date  01/02/18    Referring Provider  Liane Comber      Treadmill   MPH  1.3    Grade  0.5    Minutes  15    METs  2.09      NuStep   Level  2    SPM  80    Minutes  15    METs  2      Arm Ergometer   Level  1    Watts  22    RPM  25    Minutes  15    METs  2      Biostep-RELP   Level  2    SPM  45    Minutes  15    METs  2      Prescription Details   Frequency (times per week)  3    Duration  Progress to 45 minutes of aerobic exercise without signs/symptoms of physical distress      Intensity   THRR 40-80% of Max Heartrate  101-126    Ratings of Perceived Exertion  11-13    Perceived Dyspnea  0-4      Resistance Training   Training Prescription  Yes    Weight  3 lb    Reps  10-15       Perform Capillary Blood Glucose checks as needed.  Exercise Prescription Changes: Exercise Prescription Changes    Row Name 10/04/17 1500 10/06/17 1200 01/05/18 1200 01/09/18 1000 01/23/18 1300     Response to Exercise   Blood Pressure (Admit)  130/80  -  -  148/64  130/60   Blood Pressure (Exercise)  136/60  -  -  132/80  -   Blood Pressure (Exit)  122/62  -  -  118/60  130/72   Heart Rate (Admit)  92 bpm  -  -  76 bpm  82 bpm    Heart Rate (Exercise)  98 bpm  -  -  88 bpm  90 bpm   Heart Rate (Exit)  69 bpm  -  -  64 bpm  72 bpm   Oxygen Saturation (Admit)  99 %  -  -  97 %  99 %   Oxygen Saturation (Exercise)  98 %  -  -  97 %  99 %   Oxygen Saturation (Exit)  99 %  -  -  99 %  98 %   Rating of Perceived Exertion (Exercise)  13  -  -  12  13   Perceived Dyspnea (Exercise)  1  -  -  0  2   Symptoms  none  -  -  none  none   Duration  Continue with 45 min of aerobic exercise without signs/symptoms of physical distress.  -  -  Continue with 45 min of aerobic exercise without signs/symptoms of physical distress.  Continue with 45 min of aerobic exercise without signs/symptoms of physical distress.   Intensity  THRR unchanged  -  -  THRR unchanged  THRR unchanged     Progression  Progression  Continue to progress workloads to maintain intensity without signs/symptoms of physical distress.  -  -  Continue to progress workloads to maintain intensity without signs/symptoms of physical distress.  Continue to progress workloads to maintain intensity without signs/symptoms of physical distress.   Average METs  2.07  -  -  2.11  2.45     Resistance Training   Training Prescription  Yes  -  -  Yes  Yes   Weight  3 lbs  -  -  3 lbs  3 lbs   Reps  10-15  -  -  10-15  10-15     Interval Training   Interval Training  No  -  -  No  No     Treadmill   MPH  1.5  -  -  1.7  2.1   Grade  0.5  -  -  0.5  0.5   Minutes  15  -  -  15  15   METs  2.25  -  -  2.42  2.75     NuStep   Level  2  -  -  3  3   SPM  86  -  -  67  90   Minutes  15  -  -  15  15   METs  2  -  -  1.9  2.6     Biostep-RELP   Level  2  -  -  1  2   SPM  45  -  -  45  -   Minutes  15  -  -  15  15   METs  2  -  -  2  2     Home Exercise Plan   Plans to continue exercise at  -  Home (comment) walking, gym in [place] (comment) walk with friend at [facility] (comment) walk with friend at [facility] (comment) walk  with friend at [place] 3 additional days to program exercise sessions.  Add 3 additional days to program exercise sessions.  Add 3 additional days to program exercise sessions.  Add 3 additional days to program exercise sessions.   Initial Home Exercises Provided  -  10/06/17  01/05/18 [facility]  01/05/18 [facility]  01/05/18 [place] Name 02/08/18 1500 02/20/18 1400 03/06/18 1400         Response to Exercise   Blood Pressure (Admit)  140/60  140/82  134/56     Blood Pressure (Exit)  118/56  122/58  122/58     Heart Rate (Admit)  71 bpm  80 bpm  91 bpm     Heart Rate (Exercise)  92 bpm  96 bpm  102 bpm     Heart Rate (Exit)  71 bpm  84 bpm  73 bpm     Oxygen Saturation (Admit)  99 %  97 %  99 %     Oxygen Saturation (Exercise)  94 %  89 %  97 %     Oxygen Saturation (Exit)  100 %  95 %  98 %     Rating of Perceived Exertion (Exercise)  '13  15  13     ' Perceived Dyspnea (Exercise)  '2  2  3     ' Symptoms  none  none  none     Duration  Continue with  45 min of aerobic exercise without signs/symptoms of physical distress.  Continue with 45 min of aerobic exercise without signs/symptoms of physical distress.  Continue with 45 min of aerobic exercise without signs/symptoms of physical distress.     Intensity  THRR unchanged  THRR unchanged  THRR unchanged       Progression   Progression  Continue to progress workloads to maintain intensity without signs/symptoms of physical distress.  Continue to progress workloads to maintain intensity without signs/symptoms of physical distress.  Continue to progress workloads to maintain intensity without signs/symptoms of physical distress.     Average METs  -  3.09  2.67       Resistance Training   Training Prescription  Yes  Yes  Yes     Weight  3 lb  4 lbs  4 lbs     Reps  10-15  10-15  10-15       Interval Training   Interval Training  No  No  No       Treadmill   MPH  -  2.2  2.3     Grade  -  0  0.5     Minutes  -  15  15      METs  -  2.68  2.92       NuStep   Level  '4  4  4     ' SPM  -  75  -     Minutes  '15  15  15     ' METs  -  2.6  2.1       Biostep-RELP   Level  '5  5  5     ' SPM  43  43  44     Minutes  '15  15  15     ' METs  '2  4  3       ' Home Exercise Plan   Plans to continue exercise at  Home (comment) walk with friend at [facility] (comment) walk with friend at [facility] (comment) walk with friend at [place] 3 additional days to program exercise sessions.  Add 3 additional days to program exercise sessions.  Add 3 additional days to program exercise sessions.     Initial Home Exercises Provided  01/05/18 [facility]  01/05/18 [facility]  01/05/18 [facility]        Exercise Comments: Exercise Comments    Row Name 09/25/17 1138 01/03/18 1116 03/19/18 1140       Exercise Comments  First full day of exercise!  Patient was oriented to gym and equipment including functions, settings, policies, and procedures.  Patient's individual exercise prescription and treatment plan were reviewed.  All starting workloads were established based on the results of the 6 minute walk test done at initial orientation visit.  The plan for exercise progression was also introduced and progression will be customized based on patient's performance and goals.  First full day of exercise!  Patient was oriented to gym and equipment including functions, settings, policies, and procedures.  Patient's individual exercise prescription and treatment plan were reviewed.  All starting workloads were established based on the results of the 6 minute walk test done at initial orientation visit.  The plan for exercise progression was also introduced and progression will be customized based on patient's performance and goals.   Reed graduated today from  rehab with 27 sessions completed.  Details of the patient's exercise prescription and what She needs to do in order to continue the prescription  and progress were discussed with patient.  Patient was given a copy of prescription and goals.  Patient verbalized understanding.  Zophia plans to continue to exercise by walking three times a week with a friend.        Exercise Goals and Review: Exercise Goals    Row Name 01/02/18 1139             Exercise Goals   Increase Physical Activity  Yes       Intervention  Provide advice, education, support and counseling about physical activity/exercise needs.;Develop an individualized exercise prescription for aerobic and resistive training based on initial evaluation findings, risk stratification, comorbidities and participant's personal goals.       Expected Outcomes  Short Term: Attend rehab on a regular basis to increase amount of physical activity.;Long Term: Add in home exercise to make exercise part of routine and to increase amount of physical activity.;Long Term: Exercising regularly at least 3-5 days a week.       Increase Strength and Stamina  Yes       Intervention  Provide advice, education, support and counseling about physical activity/exercise needs.;Develop an individualized exercise prescription for aerobic and resistive training based on initial evaluation findings, risk stratification, comorbidities and participant's personal goals.       Expected Outcomes  Short Term: Increase workloads from initial exercise prescription for resistance, speed, and METs.;Short Term: Perform resistance training exercises routinely during rehab and add in resistance training at home;Long Term: Improve cardiorespiratory fitness, muscular endurance and strength as measured by increased METs and functional capacity (6MWT)       Able to understand and use rate of perceived exertion (RPE) scale  Yes       Intervention  Provide education and explanation on how to use RPE scale       Expected Outcomes  Short Term: Able to use RPE daily in rehab to express subjective intensity level;Long Term:  Able to use  RPE to guide intensity level when exercising independently       Able to understand and use Dyspnea scale  Yes       Intervention  Provide education and explanation on how to use Dyspnea scale       Expected Outcomes  Short Term: Able to use Dyspnea scale daily in rehab to express subjective sense of shortness of breath during exertion;Long Term: Able to use Dyspnea scale to guide intensity level when exercising independently       Knowledge and understanding of Target Heart Rate Range (THRR)  Yes       Intervention  Provide education and explanation of THRR including how the numbers were predicted and where they are located for reference       Expected Outcomes  Short Term: Able to state/look up THRR;Short Term: Able to use daily as guideline for intensity in rehab;Long Term: Able to use THRR to govern intensity when exercising independently       Able to check pulse independently  Yes       Intervention  Provide education and demonstration on how to check pulse in carotid and radial arteries.;Review the importance of being able to check your own pulse for safety during independent exercise       Expected Outcomes  Short Term: Able to explain why pulse checking is important during independent exercise;Long Term: Able to check pulse independently and  accurately       Understanding of Exercise Prescription  Yes       Intervention  Provide education, explanation, and written materials on patient's individual exercise prescription       Expected Outcomes  Short Term: Able to explain program exercise prescription;Long Term: Able to explain home exercise prescription to exercise independently          Exercise Goals Re-Evaluation : Exercise Goals Re-Evaluation    Row Name 09/25/17 1138 10/04/17 1515 10/06/17 1228 01/03/18 1116 01/05/18 1233     Exercise Goal Re-Evaluation   Exercise Goals Review  Understanding of Exercise Prescription;Able to understand and use Dyspnea scale;Knowledge and  understanding of Target Heart Rate Range (THRR);Able to understand and use rate of perceived exertion (RPE) scale  Increase Physical Activity;Understanding of Exercise Prescription;Increase Strength and Stamina  Understanding of Exercise Prescription;Increase Physical Activity;Increase Strength and Stamina;Able to understand and use Dyspnea scale;Knowledge and understanding of Target Heart Rate Range (THRR);Able to understand and use rate of perceived exertion (RPE) scale;Able to check pulse independently  Understanding of Exercise Prescription;Able to understand and use Dyspnea scale;Knowledge and understanding of Target Heart Rate Range (THRR);Able to understand and use rate of perceived exertion (RPE) scale  Increase Physical Activity;Able to understand and use Dyspnea scale;Understanding of Exercise Prescription;Increase Strength and Stamina;Knowledge and understanding of Target Heart Rate Range (THRR);Able to understand and use rate of perceived exertion (RPE) scale;Able to check pulse independently   Comments  Reviewed RPE scale, THR and program prescription with pt today.  Pt voiced understanding and was given a copy of goals to take home.   Dontasia is off to a good start in rehab.  She is already up to level 2 on the BioStep and NuStep.  We will continue to monitor her progression.   Reviewed home exercise with pt today.  Pt plans to walk at home for exercise. She is going to be in [place] for the next two week and will be going to the gym up there. Reviewed THR, pulse, RPE, sign and symptoms, NTG use, and when to call 911 or MD.  Also discussed weather considerations and indoor options.  Pt voiced understanding.  Reviewed RPE scale, THR and program prescription with pt today.  Pt voiced understanding and was given a copy of goals to take home.   Reviewed home exercise with pt today.  Pt plans to walk at [facility] with friend for exercise.  Reviewed THR, pulse, RPE, sign and symptoms, NTG  use, and when to call 911 or MD.  Also discussed weather considerations and indoor options.  Pt voiced understanding.   Expected Outcomes  Short: Use RPE daily to regulate intensity.  Long: Follow program prescription in THR.  Short: Continue to attend rehab regularly.  Long: Continue to increase physcial activity.   Short: Go to gym while in [place].  Long: Continue to exercise more at home as she is only attending two days a week.   Short: Use RPE daily to regulate intensity.  Long: Follow program prescription in THR.  Short: Start with 3 days walking with friend.  Long: Continue to exercise independently   [place] Name 01/09/18 1025 01/23/18 1339 02/08/18 1512 02/20/18 1442 03/06/18 1347     Exercise Goal Re-Evaluation   Exercise Goals Review  Increase Physical Activity;Understanding of Exercise Prescription;Increase Strength and Stamina  Increase Physical Activity;Understanding of Exercise Prescription;Increase Strength and Stamina  Increase Physical Activity;Able to understand and use rate of perceived exertion (RPE) scale;Increase Strength  and Stamina;Able to understand and use Dyspnea scale  Increase Physical Activity;Understanding of Exercise Prescription;Increase Strength and Stamina  Increase Physical Activity;Understanding of Exercise Prescription;Increase Strength and Stamina   Comments  Kim Owen has been doing well since returning to rehab.  She has completed three full days of exercise.  She is already up to level 3 on the NuStep.  We will continue to monitor her progression.   Kim Owen continues to do well in rehab.  She is now up to 2.1 mph on the treadmill and level 2 on the BioStep!   We will continue to monitor her progression.   Pt is progressing well with exercise  Kim Owen continues to do well with exercise.  She is now up to level 5 on the BioStep.  We will continue to monitor her progression.  Kim Owen has been doing well with rehab. She is now up to 2.3 mph on treadmill.  We will  continue to work on her progress.    Expected Outcomes  Short: Start with 3 days walking with friend.  Long: Continue to exercise independently   Short: Increase workload on NuStep to level 4.  Long: Continue to add in more exercise at home.   SHort - continue to attend class 3 times per week Long - improve overall MET level   Short: Add incline to treadmill. Long: Continue exercise at home on off days.   Short: Move back up on incline and NuStep.  Long: Continue to exercise more at home.       Discharge Exercise Prescription (Final Exercise Prescription Changes): Exercise Prescription Changes - 03/06/18 1400      Response to Exercise   Blood Pressure (Admit)  134/56    Blood Pressure (Exit)  122/58    Heart Rate (Admit)  91 bpm    Heart Rate (Exercise)  102 bpm    Heart Rate (Exit)  73 bpm    Oxygen Saturation (Admit)  99 %    Oxygen Saturation (Exercise)  97 %    Oxygen Saturation (Exit)  98 %    Rating of Perceived Exertion (Exercise)  13    Perceived Dyspnea (Exercise)  3    Symptoms  none    Duration  Continue with 45 min of aerobic exercise without signs/symptoms of physical distress.    Intensity  THRR unchanged      Progression   Progression  Continue to progress workloads to maintain intensity without signs/symptoms of physical distress.    Average METs  2.67      Resistance Training   Training Prescription  Yes    Weight  4 lbs    Reps  10-15      Interval Training   Interval Training  No      Treadmill   MPH  2.3    Grade  0.5    Minutes  15    METs  2.92      NuStep   Level  4    Minutes  15    METs  2.1      Biostep-RELP   Level  5    SPM  44    Minutes  15    METs  3      Home Exercise Plan   Plans to continue exercise at  Home (comment) walk with friend at [facility]    Frequency  Add 3 additional days to program exercise sessions.    Initial Home Exercises Provided  01/05/18 [facility]  Nutrition:  Target Goals: Understanding of  nutrition guidelines, daily intake of sodium <1563m, cholesterol <2016m calories 30% from fat and 7% or less from saturated fats, daily to have 5 or more servings of fruits and vegetables.  Biometrics: Pre Biometrics - 01/02/18 1138      Pre Biometrics   Height  '5\' 6"'  (1.676 m)    Weight  153 lb 9.6 oz (69.7 kg)    Waist Circumference  36.25 inches    Hip Circumference  39 inches    Waist to Hip Ratio  0.93 %    BMI (Calculated)  24.8      Post Biometrics - 03/14/18 1134       Post  Biometrics   Height  '5\' 6"'  (1.676 m)    Weight  150 lb 8 oz (68.3 kg)    Waist Circumference  36 inches    Hip Circumference  39 inches    Waist to Hip Ratio  0.92 %    BMI (Calculated)  24.3       Nutrition Therapy Plan and Nutrition Goals: Nutrition Therapy & Goals - 01/02/18 1115      Personal Nutrition Goals   Nutrition Goal  Learn to eat better and lose some weight.    Comments  She eats lot of vegetables and wants to learn to eat better.      Intervention Plan   Intervention  Prescribe, educate and counsel regarding individualized specific dietary modifications aiming towards targeted core components such as weight, hypertension, lipid management, diabetes, heart failure and other comorbidities.    Expected Outcomes  Short Term Goal: Understand basic principles of dietary content, such as calories, fat, sodium, cholesterol and nutrients.;Long Term Goal: Adherence to prescribed nutrition plan.       Nutrition Assessments: Nutrition Assessments - 01/02/18 1115      MEDFICTS Scores   Pre Score  27       Nutrition Goals Re-Evaluation: Nutrition Goals Re-Evaluation    [place] 01/31/18 1223 02/28/18 1245           Goals   Current Weight  148 lb (67.1 kg)  149 lb (67.6 kg)      Nutrition Goal  Eat healthier and maintain weight  Kim Owen has met her goal weight of 150lb and continues to maintain right below it. She reports not eating a lot, especially now that her swalling problem  is back.        Comment  Kim Owen lipids are high and needs to change her diet to help. She tries to eat better and has met with the dietician. She may need to take medication for her cholesterol but she is unsure if she wants to do so.  She goes this week to have another procedure done. 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 If she doesn't chew her food for a long time, she ends up throwing it back up.      Expected Outcome  Short: eat more vegatable and watch foods high in cholesterol. Long: get Lipid levels lower by dieting and exercising  Short: go to the procedure Thursday and figure out what is causing the swallowing problems. Long: work on eating more balanced meals (more protein and less cholesterol)          Nutrition Goals Discharge (Final Nutrition Goals Re-Evaluation): Nutrition Goals Re-Evaluation -  02/28/18 1245      Goals   Current Weight  149 lb (67.6 kg)    Nutrition Goal  Kim Owen has met her goal weight of 150lb and continues to maintain right below it. She reports not eating a lot, especially now that her swalling problem is back.      Comment  She goes this week to have another procedure done. 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 If she doesn't chew her food for a long time, she ends up throwing it back up.    Expected Outcome  Short: go to the procedure Thursday and figure out what is causing the swallowing problems. Long: work on eating more balanced meals (more protein and less cholesterol)        Psychosocial: Target Goals: Acknowledge presence or absence of significant depression and/or stress, maximize coping skills, provide positive support system. Participant is able to verbalize types and ability to use techniques and skills needed for  reducing stress and depression.   Initial Review & Psychosocial Screening: Initial Psych Review & Screening - 01/02/18 1115      Initial Review   Current issues with  Current Depression;Current Sleep Concerns;Current Stress Concerns    Source of Stress Concerns  Chronic Illness;Family;Unable to perform yard/household activities;Unable to participate in former interests or hobbies    Comments  Her family is still having some issues but is working through it      [place]?  Yes    Comments  Her son, daughter in law and daughter.      Barriers   Psychosocial barriers to participate in program  The patient should benefit from training in stress management and relaxation.      Screening Interventions   Interventions  To provide support and resources with identified psychosocial needs;Program counselor consult;Encouraged to exercise;Provide feedback about the scores to participant    Expected Outcomes  Long Term goal: The participant improves quality of Life and PHQ9 Scores as seen by post scores and/or verbalization of changes;Short Term goal: Identification and review with participant of any Quality of Life or Depression concerns found by scoring the questionnaire.;Long Term Goal: Stressors or current issues are controlled or eliminated.;Short Term goal: Utilizing psychosocial counselor, staff and physician to assist with identification of specific Stressors or current issues interfering with healing process. Setting desired goal for each stressor or current issue identified.       Quality of Life Scores:  Scores of 19 and below usually indicate a poorer quality of life in these areas.  A difference of  2-3 points is a clinically meaningful difference.  A difference of 2-3 points in the total score of the Quality of Life Index has been associated with significant improvement in overall quality of life, self-image, physical symptoms, and general health in studies  assessing change in quality of life.  PHQ-9: Recent Review Flowsheet Data    Depression screen [facility] 2/9 03/14/2018 01/02/2018 09/18/2017   Decreased Interest 0 2 3   Down, Depressed, Hopeless 0 1 2   PHQ - 2 Score 0 3 5   Altered sleeping 1 0 2   Tired, decreased energy '1 1 3   ' Change in appetite 1 0 0   Feeling bad or failure about yourself  0 1 1   Trouble concentrating 0 1  1   Moving slowly or fidgety/restless 0 0 1   Suicidal thoughts 0 0 0   PHQ-9 Score '3 6 13   ' Difficult doing work/chores Not difficult at all Somewhat difficult Somewhat difficult     Interpretation of Total Score  Total Score Depression Severity:  1-4 = Minimal depression, 5-9 = Mild depression, 10-14 = Moderate depression, 15-19 = Moderately severe depression, 20-27 = Severe depression   Psychosocial Evaluation and Intervention: Psychosocial Evaluation - 01/31/18 1231      Psychosocial Evaluation & Interventions   Interventions  Encouraged to exercise with the program and follow exercise prescription;Stress management education;Relaxation education    Comments  Counselor met with Ms. Jolly today Kim Owen) for initial psychosocial evaluation.  She is an 83 year old who lives in an in-law apartment connected to the home of her son and daughter-in-law.  She also has a strong support system with friends in her church community.  Kim Owen struggles with several health issues including Asthma; HBP, and high cholesterol.  She sleeps well; has a good appetite; and denies a history of depression or anxiety.  However, Kim Owen is struggling with current anxiety symptoms due to some family issues.  Counselor provided support and encouraged Kim Owen to speak with her pastor again.  She will be having her annual physical in a few weeks and counselor encouraged her to speak with the Dr. about this stress if it has not improved at that time.  She exercises; talks to others; and her faith helps her through this tough time.  She has  goals to breathe better; and to be "better/stronger" while in this program.  Staff will follow with Kim Owen.    Expected Outcomes  Short:  Kim Owen will find support/resources to help manage her current stress better.    Long:  Kim Owen will exercise for stress reduction and her overall health.    Continue Psychosocial Services   Follow up required by staff       Psychosocial Re-Evaluation:   Psychosocial Discharge (Final Psychosocial Re-Evaluation):   Education: Education Goals: Education classes will be provided on a weekly basis, covering required topics. Participant will state understanding/return demonstration of topics presented.  Learning Barriers/Preferences: Learning Barriers/Preferences - 01/02/18 1107      Learning Barriers/Preferences   Learning Barriers  Sight Wears reading glasses    Learning Preferences  None       Education Topics:  Initial Evaluation Education: - Verbal, written and demonstration of respiratory meds, oximetry and breathing techniques. Instruction on use of nebulizers and MDIs and importance of monitoring MDI activations.   Pulmonary Rehab from 03/19/2018 in [facility] Cardiac and Pulmonary Rehab  Date  01/02/18  Educator  [facility]  Instruction Review Code  1- Verbalizes Understanding      General Nutrition Guidelines/Fats and Fiber: -Group instruction provided by verbal, written material, models and posters to present the general guidelines for heart healthy nutrition. Gives an explanation and review of dietary fats and fiber.   Pulmonary Rehab from 03/19/2018 in [facility] Cardiac and Pulmonary Rehab  Date  03/19/18  Educator  CR  Instruction Review Code  1- Verbalizes Understanding      Controlling Sodium/Reading Food Labels: -Group verbal and written material supporting the discussion of sodium use in heart healthy nutrition. Review and explanation with models, verbal and written materials for utilization of the food label.   Pulmonary Rehab from 03/19/2018  in [facility] Cardiac and Pulmonary Rehab  Date  02/05/18  Educator  CR  Instruction Review Code  1- Verbalizes Understanding      Exercise Physiology & General Exercise Guidelines: - Group verbal and written instruction with models to review the exercise physiology of the cardiovascular system and associated critical values. Provides general exercise guidelines with specific guidelines to those with heart or lung disease.    Aerobic Exercise & Resistance Training: - Gives group verbal and written instruction on the various components of exercise. Focuses on aerobic and resistive training programs and the benefits of this training and how to safely progress through these programs.   Flexibility, Balance, Mind/Body Relaxation: Provides group verbal/written instruction on the benefits of flexibility and balance training, including mind/body exercise modes such as yoga, pilates and tai chi.  Demonstration and skill practice provided.   Pulmonary Rehab from 03/19/2018 in [facility] Cardiac and Pulmonary Rehab  Date  01/10/18  Educator  AS  Instruction Review Code  1- Verbalizes Understanding      Stress and Anxiety: - Provides group verbal and written instruction about the health risks of elevated stress and causes of high stress.  Discuss the correlation between heart/lung disease and anxiety and treatment options. Review healthy ways to manage with stress and anxiety.   Pulmonary Rehab from 03/19/2018 in [facility] Cardiac and Pulmonary Rehab  Date  01/31/18  Educator  [facility]  Instruction Review Code  1- Verbalizes Understanding      Depression: - Provides group verbal and written instruction on the correlation between heart/lung disease and depressed mood, treatment options, and the stigmas associated with seeking treatment.   Pulmonary Rehab from 03/19/2018 in [facility] Cardiac and Pulmonary Rehab  Date  03/14/18  Educator  [facility]  Instruction Review Code  5- Refused Teaching      Exercise & Equipment  Safety: - Individual verbal instruction and demonstration of equipment use and safety with use of the equipment.   Pulmonary Rehab from 03/19/2018 in [facility] Cardiac and Pulmonary Rehab  Date  01/02/18  Educator  [facility]  Instruction Review Code  1- Verbalizes Understanding      Infection Prevention: - Provides verbal and written material to individual with discussion of infection control including proper hand washing and proper equipment cleaning during exercise session.   Pulmonary Rehab from 03/19/2018 in [facility] Cardiac and Pulmonary Rehab  Date  01/02/18  Educator  [facility]  Instruction Review Code  1- Verbalizes Understanding      Falls Prevention: - Provides verbal and written material to individual with discussion of falls prevention and safety.   Pulmonary Rehab from 03/19/2018 in [facility] Cardiac and Pulmonary Rehab  Date  01/02/18  Educator  [facility]  Instruction Review Code  1- Verbalizes Understanding      Diabetes: - Individual verbal and written instruction to review signs/symptoms of diabetes, desired ranges of glucose level fasting, after meals and with exercise. Advice that pre and post exercise glucose checks will be done for 3 sessions at entry of program.   Chronic Lung Diseases: - Group verbal and written instruction to review updates, respiratory medications, advancements in procedures and treatments. Discuss use of supplemental oxygen including available portable oxygen systems, continuous and intermittent flow rates, concentrators, personal use and safety guidelines. Review proper use of inhaler and spacers. Provide informative websites for self-education.    Pulmonary Rehab from 03/19/2018 in [facility] Cardiac and Pulmonary Rehab  Date  02/21/18  Educator  [facility]  Instruction Review Code  1- Verbalizes Understanding      Energy Conservation: - Provide group verbal and written instruction for methods to conserve energy,  plan and organize activities. Instruct on pacing techniques, use of  adaptive equipment and posture/positioning to relieve shortness of breath.   Triggers and Exacerbations: - Group verbal and written instruction to review types of environmental triggers and ways to prevent exacerbations. Discuss weather changes, air quality and the benefits of nasal washing. Review warning signs and symptoms to help prevent infections. Discuss techniques for effective airway clearance, coughing, and vibrations.   Pulmonary Rehab from 03/19/2018 in [facility] Cardiac and Pulmonary Rehab  Date  03/09/18  Educator  [facility]  Instruction Review Code  1- Verbalizes Understanding      AED/CPR: - Group verbal and written instruction with the use of models to demonstrate the basic use of the AED with the basic ABC's of resuscitation.   Anatomy and Physiology of the Lungs: - Group verbal and written instruction with the use of models to provide basic lung anatomy and physiology related to function, structure and complications of lung disease.   Anatomy & Physiology of the Heart: - Group verbal and written instruction and models provide basic cardiac anatomy and physiology, with the coronary electrical and arterial systems. Review of Valvular disease and Heart Failure   Pulmonary Rehab from 03/19/2018 in [facility] Cardiac and Pulmonary Rehab  Date  01/03/18  Educator  [facility]  Instruction Review Code  1- Verbalizes Understanding      Cardiac Medications: - Group verbal and written instruction to review commonly prescribed medications for heart disease. Reviews the medication, class of the drug, and side effects.   Know Your Numbers and Risk Factors: -Group verbal and written instruction about important numbers in your health.  Discussion of what are risk factors and how they play a role in the disease process.  Review of Cholesterol, Blood Pressure, Diabetes, and BMI and the role they play in your overall health.   Sleep Hygiene: -Provides group verbal and written instruction about how sleep can  affect your health.  Define sleep hygiene, discuss sleep cycles and impact of sleep habits. Review good sleep hygiene tips.    Pulmonary Rehab from 03/19/2018 in [facility] Cardiac and Pulmonary Rehab  Date  02/28/18  Educator  [facility]  Instruction Review Code  1- Verbalizes Understanding      Other: -Provides group and verbal instruction on various topics (see comments)   Pulmonary Rehab from 10/02/2017 in [facility] Cardiac and Pulmonary Rehab  Date  09/27/17  Educator  [facility]  Instruction Review Code  1- Verbalizes Understanding       Knowledge Questionnaire Score: Knowledge Questionnaire Score - 03/14/18 1145      Knowledge Questionnaire Score   Pre Score  13/18    Post Score  17/18 REVIEWED with patient        Core Components/Risk Factors/Patient Goals at Admission: Personal Goals and Risk Factors at Admission - 01/02/18 1123      Core Components/Risk Factors/Patient Goals on Admission    Weight Management  Yes;Weight Loss    Intervention  Weight Management: Develop a combined nutrition and exercise program designed to reach desired caloric intake, while maintaining appropriate intake of nutrient and fiber, sodium and fats, and appropriate energy expenditure required for the weight goal.;Weight Management: Provide education and appropriate resources to help participant work on and attain dietary goals.;Weight Management/Obesity: Establish reasonable short term and long term weight goals.    Admit Weight  153 lb 9.6 oz (69.7 kg)    Goal Weight: Short Term  148 lb (67.1 kg)    Goal Weight: Long Term  148 lb (67.1 kg)    Expected Outcomes  Short Term: Continue to assess and modify interventions until short term weight is achieved;Long Term: Adherence to nutrition and physical activity/exercise program aimed toward attainment of established weight goal;Weight Loss: Understanding of general recommendations for a balanced deficit meal plan, which promotes 1-2 lb weight loss per week and includes a  negative energy balance of [phone] kcal/d;Understanding recommendations for meals to include 15-35% energy as protein, 25-35% energy from fat, 35-60% energy from carbohydrates, less than 229m of dietary cholesterol, 20-35 gm of total fiber daily;Understanding of distribution of calorie intake throughout the day with the consumption of 4-5 meals/snacks;Weight Maintenance: Understanding of the daily nutrition guidelines, which includes 25-35% calories from fat, 7% or less cal from saturated fats, less than 2065mcholesterol, less than 1.5gm of sodium, & 5 or more servings of fruits and vegetables daily    Improve shortness of breath with ADL's  Yes    Intervention  Provide education, individualized exercise plan and daily activity instruction to help decrease symptoms of SOB with activities of daily living.    Expected Outcomes  Short Term: Improve cardiorespiratory fitness to achieve a reduction of symptoms when performing ADLs;Long Term: Be able to perform more ADLs without symptoms or delay the onset of symptoms    Hypertension  Yes    Intervention  Provide education on lifestyle modifcations including regular physical activity/exercise, weight management, moderate sodium restriction and increased consumption of fresh fruit, vegetables, and low fat dairy, alcohol moderation, and smoking cessation.;Monitor prescription use compliance.    Expected Outcomes  Short Term: Continued assessment and intervention until BP is < 140/9046mG in hypertensive participants. < 130/58m60m in hypertensive participants with diabetes, heart failure or chronic kidney disease.;Long Term: Maintenance of blood pressure at goal levels.    Lipids  Yes    Intervention  Provide education and support for participant on nutrition & aerobic/resistive exercise along with prescribed medications to achieve LDL <70mg86mL >40mg.51mExpected Outcomes  Short Term: Participant states understanding of desired cholesterol values and is  compliant with medications prescribed. Participant is following exercise prescription and nutrition guidelines.;Long Term: Cholesterol controlled with medications as prescribed, with individualized exercise RX and with personalized nutrition plan. Value goals: LDL < 70mg, 59m> 40 mg.       Core Components/Risk Factors/Patient Goals Review:  Goals and Risk Factor Review    Row Name 01/31/18 1211 02/28/18 1240           Core Components/Risk Factors/Patient Goals Review   Personal Goals Review  Weight Management/Obesity;Improve shortness of breath with ADL's;Hypertension;Lipids  Weight Management/Obesity;Improve shortness of breath with ADL's;Hypertension;Lipids      Review  Kim Owen has come down in her weight since she started the program. She takes medication for her blood pressure and it has been stable. Her last reading was 124/58. She states her breathing is not that great at home. Her breathing sometime feels short at home but she does not check her oxygen. She feels like her oxygen is fine but sometimes she feels more short of breath at home then in class. She has had her cholesterol checked recently and her cholesterol was high. Her doctor states that she may want to put her on cholesterol medication but is not sure yet.  Kim Owen has met her goal weight of 150lb and has maintained it. She reports not eating too much because of her swallowing issues. She goes for a procedure this week to see  if there is anything wrong and what they can do. She also reports that her SOB has been okay, but she has been coughing up some stuff. She is communicating this with her doctor, she is still flushing her sinuses with medication.  Her doctor decided not to put her on any cholesterol medication, and just focus on diet and exercise      Expected Outcomes  Short: change diet to help with cholesterol. Long: take medication for Lipids if need be and get lipids lower.  Short: Incoporate a low cholesterol diet daily.  Long: continue exercising post graduation to help with SOB and cholesterol. Maintain weight.          Core Components/Risk Factors/Patient Goals at Discharge (Final Review):  Goals and Risk Factor Review - 02/28/18 1240      Core Components/Risk Factors/Patient Goals Review   Personal Goals Review  Weight Management/Obesity;Improve shortness of breath with ADL's;Hypertension;Lipids    Review  Kim Owen has met her goal weight of 150lb and has maintained it. She reports not eating too much because of her swallowing issues. She goes for a procedure this week to see if there is anything wrong and what they can do. She also reports that her SOB has been okay, but she has been coughing up some stuff. She is communicating this with her doctor, she is still flushing her sinuses with medication.  Her doctor decided not to put her on any cholesterol medication, and just focus on diet and exercise    Expected Outcomes  Short: Incoporate a low cholesterol diet daily. Long: continue exercising post graduation to help with SOB and cholesterol. Maintain weight.        ITP Comments: ITP Comments    Row Name 01/02/18 1105 01/08/18 0841 02/05/18 0831 03/05/18 1119 03/19/18 1140   ITP Comments  Medical Evaluation completed. Chart sent for review and changes to Dr. Emily Filbert Director of [place]. Diagnosis can be found in [facility] encounter 12/15/17   30 day review completed. ITP sent to Dr. Emily Filbert Director of [place]. Continue with ITP unless changes are made by physician   30 day review completed. ITP sent to Dr. Emily Filbert Director of [place]. Continue with ITP unless changes are made by physician   30 day review completed. ITP sent to Dr. Emily Filbert Director of [place]. Continue with ITP unless changes are made by physician  Discharge ITP sent and signed by Dr. Sabra Heck.  Discharge Summary routed to PCP and pulmonologist.      Comments: Discharge ITP

## 2019-03-26 IMAGING — CR DG LUMBAR SPINE 2-3V
3 series · 3 of 3 positions shown · non-contrast
Comparison: None.

CLINICAL DATA: Low back pain with radiation into the left leg

EXAM:
LUMBAR SPINE - 3 VIEW

[l-spine ap]
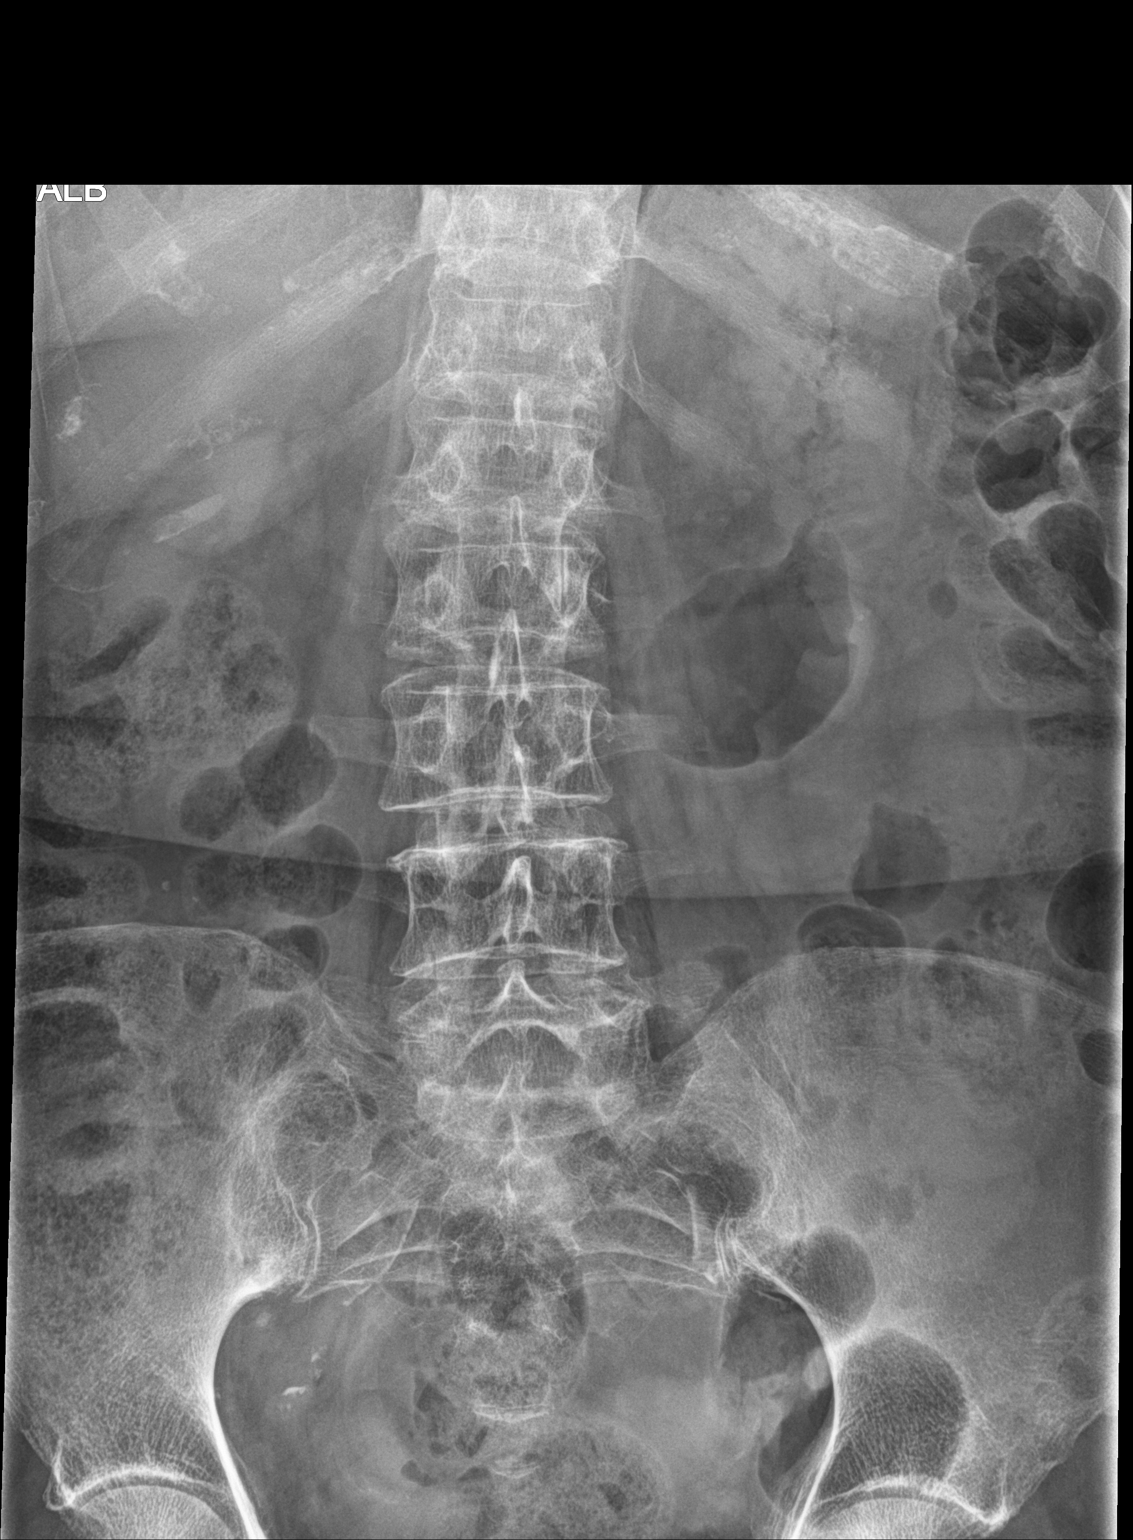

[l-spine lat]
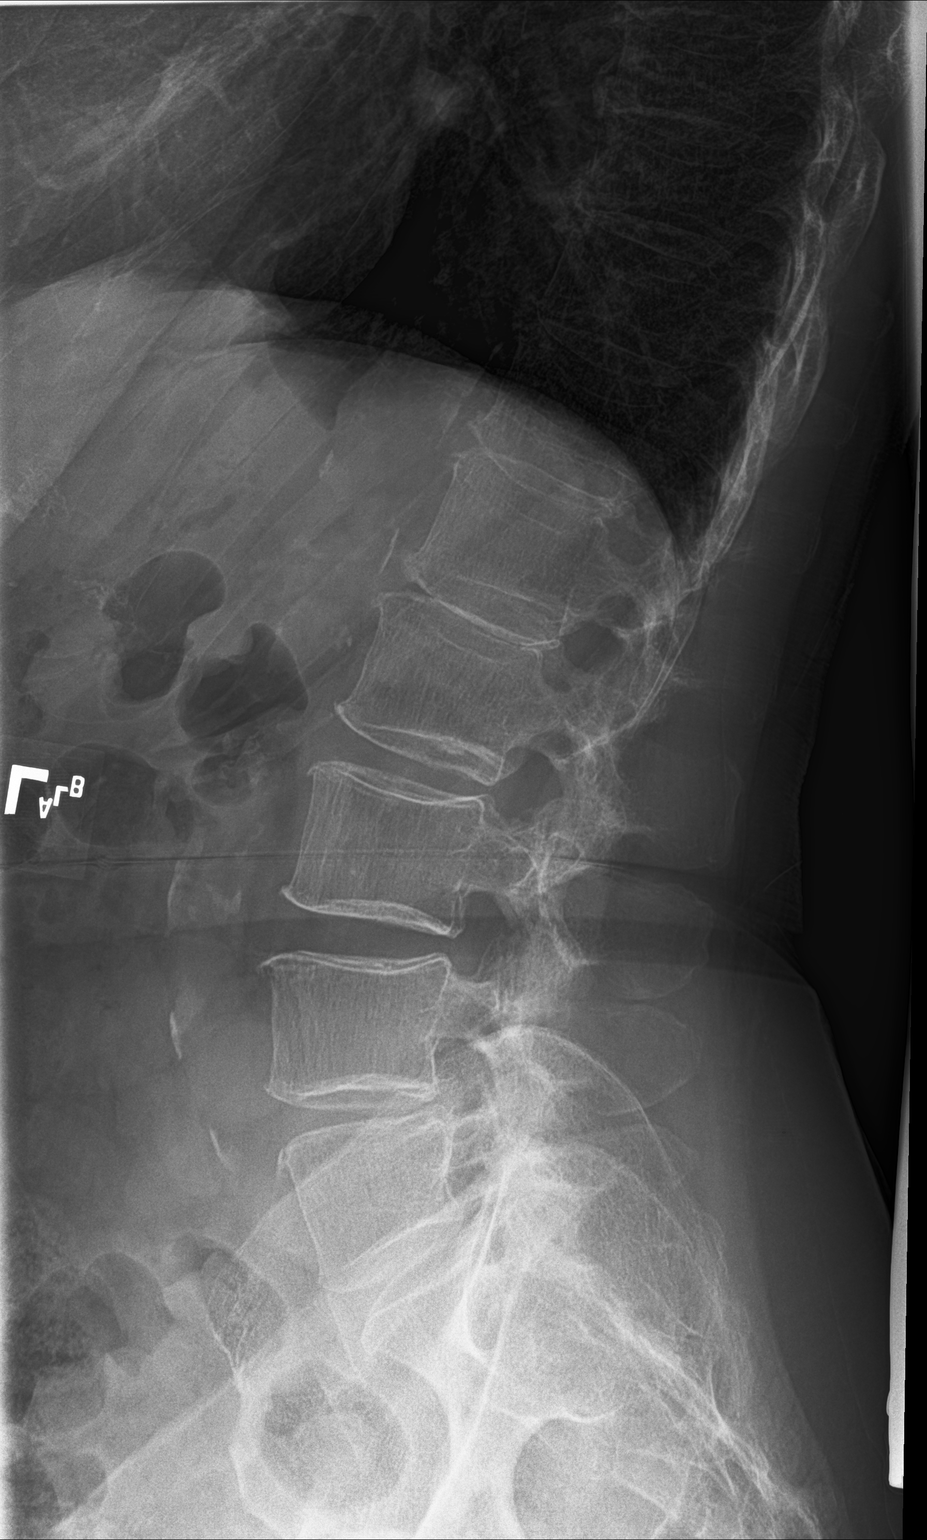

[l-spine spot]
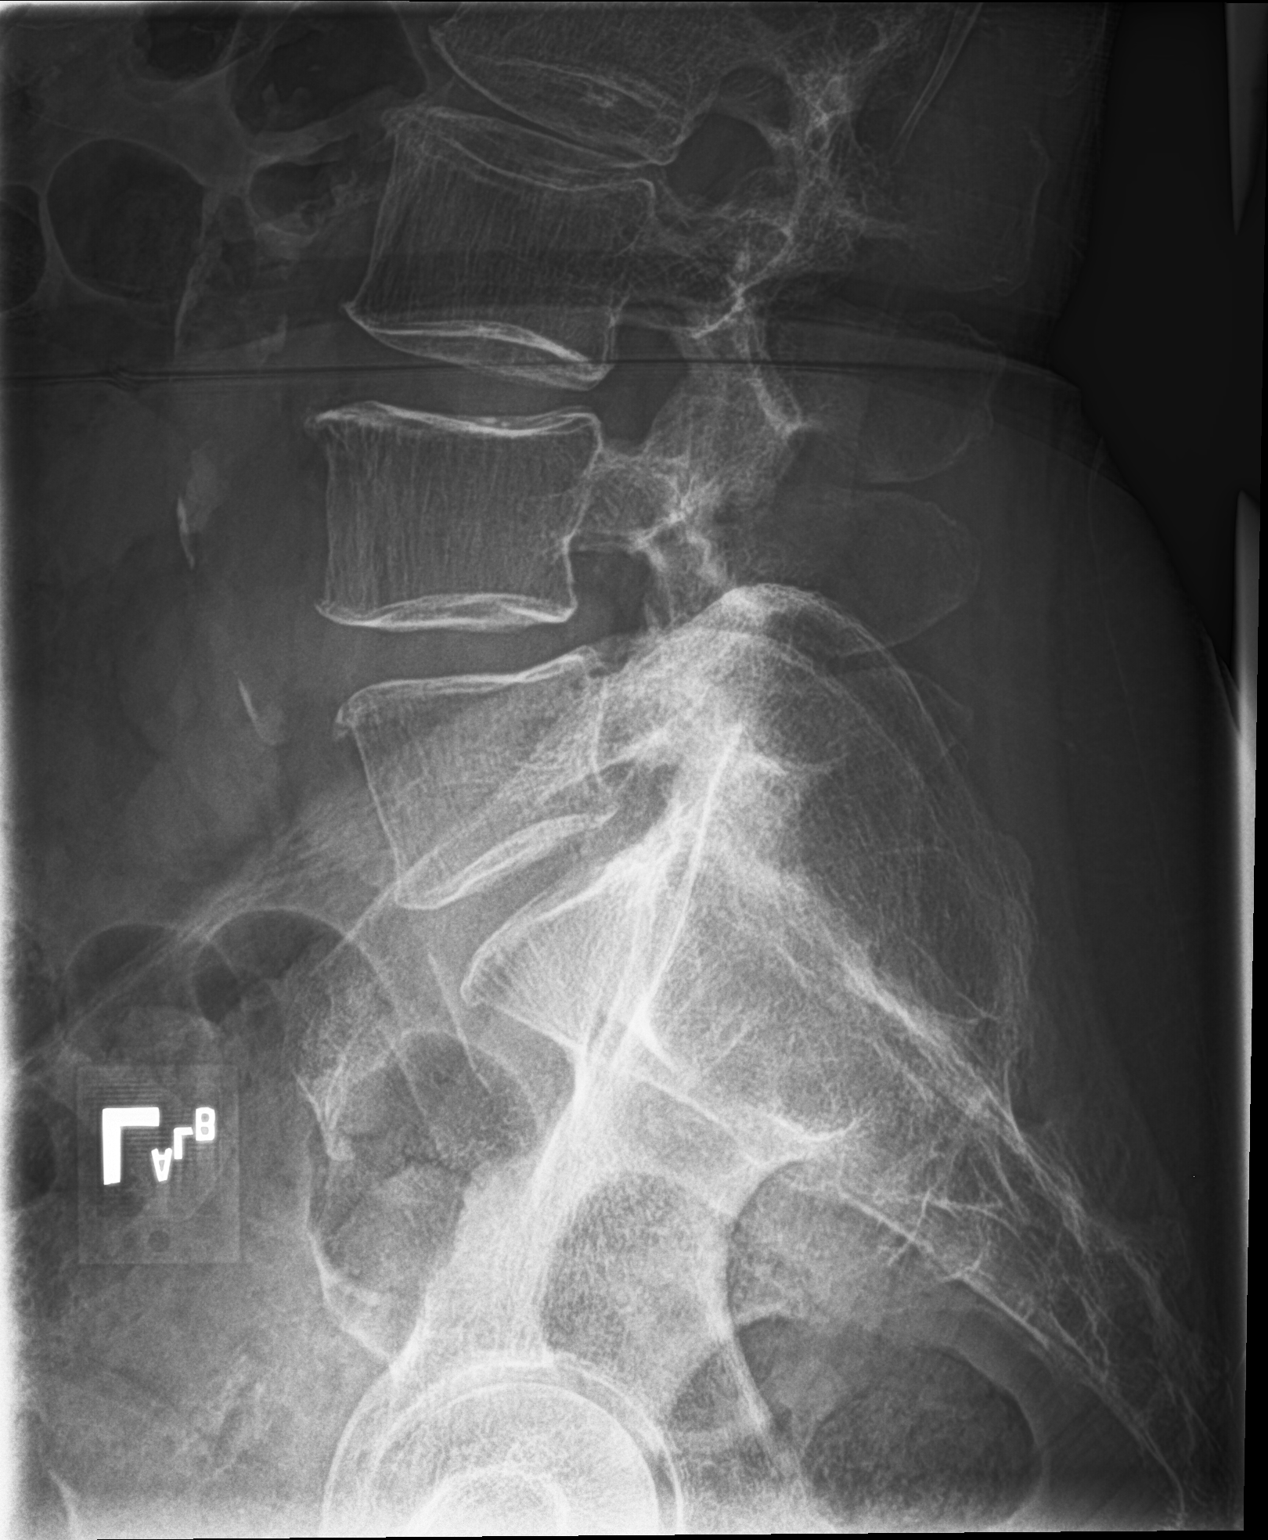

[3 of 3 positions shown; findings below may reference images not displayed]

FINDINGS: Five lumbar type vertebral bodies are well visualized. Vertebral
body height is well maintained. No anterolisthesis is seen. Mild
osteophytic changes are noted. No soft tissue abnormality is seen.
IMPRESSION: Mild degenerative change without acute abnormality.
